# Patient Record
Sex: Female | Born: 2000 | Race: Black or African American | Hispanic: No | Marital: Single
Health system: Midwestern US, Community
[De-identification: ages and names within clinical notes are randomized; demographics above are authoritative.]

## PROBLEM LIST (undated history)

## (undated) VITALS — Temp 98.9°F

## (undated) DIAGNOSIS — J45909 Unspecified asthma, uncomplicated: Secondary | ICD-10-CM

## (undated) DIAGNOSIS — R519 Headache, unspecified: Secondary | ICD-10-CM

---
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
## 2019-06-12 ENCOUNTER — Emergency Department
Admission: EM | Admit: 2019-06-12 | Discharge: 2019-06-12 | Disposition: A | Discharge status: Home / Self Care | Payer: BC Managed Care – PPO | Attending: Emergency Medicine | Admitting: Emergency Medicine

## 2019-06-12 ENCOUNTER — Other Ambulatory Visit: Payer: Self-pay

## 2019-06-12 ENCOUNTER — Encounter: Payer: Self-pay | Admitting: Emergency Medicine

## 2019-06-12 DIAGNOSIS — Y929 Unspecified place or not applicable: Secondary | ICD-10-CM | POA: Insufficient documentation

## 2019-06-12 DIAGNOSIS — W2209XA Striking against other stationary object, initial encounter: Secondary | ICD-10-CM | POA: Insufficient documentation

## 2019-06-12 DIAGNOSIS — Y939 Activity, unspecified: Secondary | ICD-10-CM | POA: Insufficient documentation

## 2019-06-12 DIAGNOSIS — S61309A Unspecified open wound of unspecified finger with damage to nail, initial encounter: Secondary | ICD-10-CM | POA: Diagnosis not present

## 2019-06-12 DIAGNOSIS — Y999 Unspecified external cause status: Secondary | ICD-10-CM | POA: Diagnosis not present

## 2019-06-12 MED ORDER — CEPHALEXIN 500 MG PO CAPS: 500.0000 mg | ORAL_CAPSULE | Freq: Two times a day (BID) | ORAL | 0 refills | Status: AC

## 2019-06-12 MED ORDER — LIDOCAINE HCL (PF) 1 % IJ SOLN: 5.0000 mL | Freq: Once | INTRAMUSCULAR | Status: DC

## 2019-06-12 MED ORDER — CEPHALEXIN 500 MG PO CAPS
500.0000 mg | ORAL_CAPSULE | Freq: Once | ORAL | Status: AC
  Administered 2019-06-12: 500 mg via ORAL
  Filled 2019-06-12: qty 1

## 2019-06-12 MED ORDER — OXYCODONE-ACETAMINOPHEN 5-325 MG PO TABS
1.0000 | ORAL_TABLET | Freq: Once | ORAL | Status: AC
  Administered 2019-06-12: 1 via ORAL
  Filled 2019-06-12: qty 1

## 2019-06-12 NOTE — ED Provider Notes (Signed)
[facility] Emergency Department Provider Note ______   First MD Initiated Contact with Patient 06/12/19 [phone]     (approximate)  I have reviewed the triage vital signs and the nursing notes.   HISTORY  Chief Complaint Finger Injury    HPI Elizabeth Bell is a 18 y.o. female presents to the emergency department secondary to avulsion of left thumb acrylic and native fingernail tonight.  Patient admits to 10 out of 10 left thumb pain.  Patient states that nail was struck against something which raised the nail off the nail bed.        History reviewed. No pertinent past medical history.  There are no active problems to display for this patient.   History reviewed. No pertinent surgical history.  Prior to Admission medications   Medication Sig Start Date End Date Taking? Authorizing Provider  cephALEXin (KEFLEX) 500 MG capsule Take 1 capsule (500 mg total) by mouth 2 (two) times daily for 7 days. 06/12/19 06/19/19  Gregor Hams, MD    Allergies Patient has no known allergies.  No family history on file.  Social History Social History   Tobacco Use  . Smoking status: Never Smoker  . Smokeless tobacco: Never Used  Substance Use Topics  . Alcohol use: Not on file  . Drug use: Not on file    Review of Systems Constitutional: No fever/chills Eyes: No visual changes. ENT: No sore throat. Cardiovascular: Denies chest pain. Respiratory: Denies shortness of breath. Gastrointestinal: No abdominal pain.  No nausea, no vomiting.  No diarrhea.  No constipation. Genitourinary: Negative for dysuria. Musculoskeletal: Negative for neck pain.  Negative for back pain.  Positive for left thumb pain Integumentary: Negative for rash. Neurological: Negative for headaches, focal weakness or numbness.  ____________________________________________   PHYSICAL EXAM:  VITAL SIGNS: ED Triage Vitals  Enc Vitals Group     BP --      Pulse --      Resp  --      Temp --      Temp src --      SpO2 --      Weight 06/12/19 0027 46.3 kg (102 lb)     Height 06/12/19 0027 1.524 m (5')     Head Circumference --      Peak Flow --      Pain Score 06/12/19 0028 10     Pain Loc --      Pain Edu? --      Excl. in [place]? --     Constitutional: Alert and oriented.  Apparent discomfort Eyes: Conjunctivae are normal.  Neck: No stridor.  No meningeal signs.   Musculoskeletal: Partially avulsed left thumb fingernail however nailbed still intact Neurologic:  Normal speech and language. No gross focal neurologic deficits are appreciated.  Skin:  Skin is warm, dry and intact. Psychiatric: Mood and affect are normal. Speec .Nerve Block  Date/Time: 06/12/2019 4:01 AM Performed by: Gregor Hams, MD Authorized by: Gregor Hams, MD   Consent:    Consent obtained:  Verbal   Risks discussed:  Pain and unsuccessful block   Alternatives discussed:  Alternative treatment Indications:    Indications:  Pain relief and procedural anesthesia Pre-procedure details:    Skin preparation:  Alcohol   Preparation: Patient was prepped and draped in usual sterile fashion   Skin anesthesia (see MAR for exact dosages):    Skin anesthesia method:  Local infiltration   Local anesthetic:  Lidocaine 1%  w/o epi Procedure details (see MAR for exact dosages):    Block needle gauge:  25 G Post-procedure details:    Outcome:  Pain improved   Patient tolerance of procedure:  Tolerated well, no immediate complications     ____________________________________________   INITIAL IMPRESSION / MDM / ASSESSMENT AND PLAN / ED COURSE  As part of my medical decision making, I reviewed the following data within the electronic MEDICAL RECORD NUMBER   18 year old female presenting with above-stated history and physical exam secondary to partially avulsed left thumb fingernail.  Digital block performed wound subsequently irrigated with saline and Betadine nailbed secured under  the skin nail subsequently taped down to the nailbed  ____________________________________________  FINAL CLINICAL IMPRESSION(S) / ED DIAGNOSES  Final diagnoses:  Avulsion of fingernail, initial encounter     MEDICATIONS GIVEN DURING THIS VISIT:  Medications  lidocaine (PF) (XYLOCAINE) 1 % injection 5 mL (has no administration in time range)  oxyCODONE-acetaminophen (PERCOCET/ROXICET) 5-325 MG per tablet 1 tablet (1 tablet Oral Given 06/12/19 0257)  cephALEXin (KEFLEX) capsule 500 mg (500 mg Oral Given 06/12/19 0257)     ED Discharge Orders         Ordered    cephALEXin (KEFLEX) 500 MG capsule  2 times daily     06/12/19 0255          *Please note:  Elizabeth Bell was evaluated in Emergency Department on 06/12/2019 for the symptoms described in the history of present illness. She was evaluated in the context of the global COVID-19 pandemic, which necessitated consideration that the patient might be at risk for infection with the SARS-CoV-2 virus that causes COVID-19. Institutional protocols and algorithms that pertain to the evaluation of patients at risk for COVID-19 are in a state of rapid change based on information released by regulatory bodies including the CDC and federal and state organizations. These policies and algorithms were followed during the patient's care in the ED.  Some ED evaluations and interventions may be delayed as a result of limited staffing during the pandemic.*  Note:  This document was prepared using Dragon voice recognition software and may include unintentional dictation errors.   Gregor Hams, MD 06/12/19 [phone]

## 2019-06-12 NOTE — ED Triage Notes (Signed)
Patient ambulatory to triage with steady gait, without difficulty or distress noted, mask in place; pt reports acrylic thumbnail came off PTA injuring nailbed

## 2020-02-06 ENCOUNTER — Encounter (HOSPITAL_COMMUNITY): Payer: Self-pay

## 2020-02-06 ENCOUNTER — Emergency Department (HOSPITAL_COMMUNITY)
Admission: EM | Admit: 2020-02-06 | Discharge: 2020-02-07 | Disposition: A | Discharge status: Home / Self Care | Payer: BC Managed Care – PPO | Attending: Emergency Medicine | Admitting: Emergency Medicine

## 2020-02-06 ENCOUNTER — Emergency Department (HOSPITAL_COMMUNITY): Payer: BC Managed Care – PPO

## 2020-02-06 DIAGNOSIS — M25571 Pain in right ankle and joints of right foot: Secondary | ICD-10-CM | POA: Diagnosis not present

## 2020-02-06 DIAGNOSIS — Z5321 Procedure and treatment not carried out due to patient leaving prior to being seen by health care provider: Secondary | ICD-10-CM | POA: Diagnosis not present

## 2020-02-06 NOTE — ED Notes (Signed)
PT stated they were leaving
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
## 2020-10-01 ENCOUNTER — Other Ambulatory Visit: Payer: Self-pay

## 2020-10-01 ENCOUNTER — Emergency Department (HOSPITAL_COMMUNITY)
Admission: EM | Admit: 2020-10-01 | Discharge: 2020-10-01 | Disposition: A | Discharge status: Home / Self Care | Payer: BC Managed Care – PPO | Attending: Emergency Medicine | Admitting: Emergency Medicine

## 2020-10-01 DIAGNOSIS — R112 Nausea with vomiting, unspecified: Secondary | ICD-10-CM | POA: Insufficient documentation

## 2020-10-01 DIAGNOSIS — C719 Malignant neoplasm of brain, unspecified: Secondary | ICD-10-CM | POA: Diagnosis present

## 2020-10-01 DIAGNOSIS — Z5321 Procedure and treatment not carried out due to patient leaving prior to being seen by health care provider: Secondary | ICD-10-CM | POA: Insufficient documentation

## 2020-10-01 DIAGNOSIS — R109 Unspecified abdominal pain: Secondary | ICD-10-CM | POA: Insufficient documentation

## 2020-10-01 DIAGNOSIS — R630 Anorexia: Secondary | ICD-10-CM | POA: Insufficient documentation

## 2020-10-01 DIAGNOSIS — M5481 Occipital neuralgia: Secondary | ICD-10-CM | POA: Diagnosis not present

## 2020-10-01 LAB — COMPREHENSIVE METABOLIC PANEL
ALT: 11 U/L (ref 0–44)
AST: 18 U/L (ref 15–41)
Albumin: 3.7 g/dL (ref 3.5–5.0)
Alkaline Phosphatase: 71 U/L (ref 38–126)
Anion gap: 10 (ref 5–15)
BUN: 8 mg/dL (ref 6–20)
CO2: 26 mmol/L (ref 22–32)
Calcium: 9.1 mg/dL (ref 8.9–10.3)
Chloride: 103 mmol/L (ref 98–111)
Creatinine, Ser: 0.71 mg/dL (ref 0.44–1.00)
GFR, Estimated: >60 mL/min (ref >60)
Glucose, Bld: 83 mg/dL (ref 70–99)
Potassium: 4 mmol/L (ref 3.5–5.1)
Sodium: 139 mmol/L (ref 135–145)
Total Bilirubin: 0.4 mg/dL (ref 0.3–1.2)
Total Protein: 6.9 g/dL (ref 6.5–8.1)

## 2020-10-01 LAB — URINALYSIS, ROUTINE W REFLEX MICROSCOPIC
Bilirubin Urine: NEGATIVE
Glucose, UA: NEGATIVE mg/dL
Hgb urine dipstick: NEGATIVE
Ketones, ur: NEGATIVE mg/dL
Leukocytes,Ua: NEGATIVE
Nitrite: NEGATIVE
Protein, ur: NEGATIVE mg/dL
Specific Gravity, Urine: 1.018 (ref 1.005–1.030)
pH: 6 (ref 5.0–8.0)

## 2020-10-01 LAB — CBC
HCT: 40.4 % (ref 36.0–46.0)
Hemoglobin: 13.1 g/dL (ref 12.0–15.0)
MCH: 30.6 pg (ref 26.0–34.0)
MCHC: 32.4 g/dL (ref 30.0–36.0)
MCV: 94.4 fL (ref 80.0–100.0)
Platelets: 281 10*3/uL (ref 150–400)
RBC: 4.28 MIL/uL (ref 3.87–5.11)
RDW: 12.6 % (ref 11.5–15.5)
WBC: 4.2 10*3/uL (ref 4.0–10.5)
nRBC: 0 % (ref 0.0–0.2)

## 2020-10-01 LAB — LIPASE, BLOOD: Lipase: 26 U/L (ref 11–51)

## 2020-10-01 LAB — I-STAT BETA HCG BLOOD, ED (MC, WL, AP ONLY): I-stat hCG, quantitative: <5 m[IU]/mL (ref <5)
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
## 2020-10-01 NOTE — ED Triage Notes (Signed)
Pt reports dx with brain tumor. Reports Nausea, pain to posterior head. Today pt reports unable to finish her breakfast. Pt reports frequent emesis. Pt reports decreased appetite and R sided abdominal pain. Pt states MD was worried about possible gallbladder issues.

## 2020-10-01 NOTE — ED Notes (Addendum)
Pt stated that she is going to leave. Pt encouraged to follow up with her primary care provider or come back if she feels like she is having an emergency.

## 2021-02-19 ENCOUNTER — Other Ambulatory Visit: Payer: Self-pay

## 2021-02-19 ENCOUNTER — Encounter (HOSPITAL_COMMUNITY): Payer: Self-pay | Admitting: Emergency Medicine

## 2021-02-19 ENCOUNTER — Inpatient Hospital Stay (HOSPITAL_COMMUNITY): Payer: BC Managed Care – PPO

## 2021-02-19 ENCOUNTER — Inpatient Hospital Stay (HOSPITAL_COMMUNITY)
Admission: EM | Admit: 2021-02-19 | Discharge: 2021-02-23 | DRG: 604 | Disposition: A | Discharge status: Other Acute Inpatient Hospital | Payer: BC Managed Care – PPO | Attending: Internal Medicine | Admitting: Internal Medicine

## 2021-02-19 ENCOUNTER — Emergency Department (HOSPITAL_COMMUNITY): Payer: BC Managed Care – PPO

## 2021-02-19 DIAGNOSIS — S61511A Laceration without foreign body of right wrist, initial encounter: Secondary | ICD-10-CM | POA: Diagnosis present

## 2021-02-19 DIAGNOSIS — D352 Benign neoplasm of pituitary gland: Secondary | ICD-10-CM | POA: Diagnosis present

## 2021-02-19 DIAGNOSIS — S61512A Laceration without foreign body of left wrist, initial encounter: Secondary | ICD-10-CM | POA: Diagnosis present

## 2021-02-19 DIAGNOSIS — G312 Degeneration of nervous system due to alcohol: Secondary | ICD-10-CM | POA: Diagnosis present

## 2021-02-19 DIAGNOSIS — R45851 Suicidal ideations: Secondary | ICD-10-CM | POA: Diagnosis present

## 2021-02-19 DIAGNOSIS — G9340 Encephalopathy, unspecified: Secondary | ICD-10-CM | POA: Diagnosis not present

## 2021-02-19 DIAGNOSIS — F10129 Alcohol abuse with intoxication, unspecified: Secondary | ICD-10-CM | POA: Diagnosis present

## 2021-02-19 DIAGNOSIS — J9601 Acute respiratory failure with hypoxia: Secondary | ICD-10-CM | POA: Diagnosis present

## 2021-02-19 DIAGNOSIS — F332 Major depressive disorder, recurrent severe without psychotic features: Secondary | ICD-10-CM | POA: Diagnosis present

## 2021-02-19 DIAGNOSIS — Y906 Blood alcohol level of 120-199 mg/100 ml: Secondary | ICD-10-CM | POA: Diagnosis present

## 2021-02-19 DIAGNOSIS — Z23 Encounter for immunization: Secondary | ICD-10-CM

## 2021-02-19 DIAGNOSIS — Z20822 Contact with and (suspected) exposure to covid-19: Secondary | ICD-10-CM | POA: Diagnosis present

## 2021-02-19 DIAGNOSIS — Z781 Physical restraint status: Secondary | ICD-10-CM | POA: Diagnosis not present

## 2021-02-19 DIAGNOSIS — X780XXA Intentional self-harm by sharp glass, initial encounter: Secondary | ICD-10-CM | POA: Diagnosis present

## 2021-02-19 DIAGNOSIS — R092 Respiratory arrest: Secondary | ICD-10-CM

## 2021-02-19 HISTORY — DX: Headache, unspecified: R51.9

## 2021-02-19 HISTORY — DX: Unspecified asthma, uncomplicated: J45.909

## 2021-02-19 LAB — RAPID URINE DRUG SCREEN, HOSP PERFORMED
Amphetamines: NOT DETECTED
Barbiturates: NOT DETECTED
Benzodiazepines: POSITIVE — AB
Cocaine: NOT DETECTED
Opiates: NOT DETECTED
Tetrahydrocannabinol: NOT DETECTED

## 2021-02-19 LAB — BLOOD GAS, VENOUS
Acid-base deficit: 4.4 mmol/L — ABNORMAL HIGH (ref 0.0–2.0)
Acid-base deficit: 4.1 mmol/L — ABNORMAL HIGH (ref 0.0–2.0)
Bicarbonate: 18.2 mmol/L — ABNORMAL LOW (ref 20.0–28.0)
Bicarbonate: 21.8 mmol/L (ref 20.0–28.0)
O2 Saturation: 84.3 %
O2 Saturation: 72.8 %
Patient temperature: 98.6
Patient temperature: 98.6
pCO2, Ven: 27.9 mmHg — ABNORMAL LOW (ref 44.0–60.0)
pCO2, Ven: 45.2 mmHg (ref 44.0–60.0)
pH, Ven: 7.429 (ref 7.250–7.430)
pH, Ven: 7.304 (ref 7.250–7.430)
pO2, Ven: 49.9 mmHg — ABNORMAL HIGH (ref 32.0–45.0)
pO2, Ven: 45 mmHg (ref 32.0–45.0)

## 2021-02-19 LAB — CBC
HCT: 39 % (ref 36.0–46.0)
HCT: 40 % (ref 36.0–46.0)
Hemoglobin: 13.2 g/dL (ref 12.0–15.0)
Hemoglobin: 13.4 g/dL (ref 12.0–15.0)
MCH: 31.7 pg (ref 26.0–34.0)
MCH: 31.5 pg (ref 26.0–34.0)
MCHC: 33.8 g/dL (ref 30.0–36.0)
MCHC: 33.5 g/dL (ref 30.0–36.0)
MCV: 93.8 fL (ref 80.0–100.0)
MCV: 93.9 fL (ref 80.0–100.0)
Platelets: 280 10*3/uL (ref 150–400)
Platelets: 261 10*3/uL (ref 150–400)
RBC: 4.16 MIL/uL (ref 3.87–5.11)
RBC: 4.26 MIL/uL (ref 3.87–5.11)
RDW: 12.3 % (ref 11.5–15.5)
RDW: 12.2 % (ref 11.5–15.5)
WBC: 6.2 10*3/uL (ref 4.0–10.5)
WBC: 5.9 10*3/uL (ref 4.0–10.5)
nRBC: 0 % (ref 0.0–0.2)
nRBC: 0 % (ref 0.0–0.2)

## 2021-02-19 LAB — MAGNESIUM: Magnesium: 2.2 mg/dL (ref 1.7–2.4)

## 2021-02-19 LAB — TSH: TSH: 1.142 u[IU]/mL (ref 0.350–4.500)

## 2021-02-19 LAB — COMPREHENSIVE METABOLIC PANEL
ALT: 10 U/L (ref 0–44)
AST: 18 U/L (ref 15–41)
Albumin: 4.2 g/dL (ref 3.5–5.0)
Alkaline Phosphatase: 76 U/L (ref 38–126)
Anion gap: 7 (ref 5–15)
BUN: 8 mg/dL (ref 6–20)
CO2: 25 mmol/L (ref 22–32)
Calcium: 9.3 mg/dL (ref 8.9–10.3)
Chloride: 108 mmol/L (ref 98–111)
Creatinine, Ser: 0.78 mg/dL (ref 0.44–1.00)
GFR, Estimated: >60 mL/min (ref >60)
Glucose, Bld: 76 mg/dL (ref 70–99)
Potassium: 3.3 mmol/L — ABNORMAL LOW (ref 3.5–5.1)
Sodium: 140 mmol/L (ref 135–145)
Total Bilirubin: 0.4 mg/dL (ref 0.3–1.2)
Total Protein: 7.2 g/dL (ref 6.5–8.1)

## 2021-02-19 LAB — BASIC METABOLIC PANEL
Anion gap: 12 (ref 5–15)
BUN: 6 mg/dL (ref 6–20)
CO2: 21 mmol/L — ABNORMAL LOW (ref 22–32)
Calcium: 9.3 mg/dL (ref 8.9–10.3)
Chloride: 107 mmol/L (ref 98–111)
Creatinine, Ser: 0.57 mg/dL (ref 0.44–1.00)
GFR, Estimated: >60 mL/min (ref >60)
Glucose, Bld: 91 mg/dL (ref 70–99)
Potassium: 3.5 mmol/L (ref 3.5–5.1)
Sodium: 140 mmol/L (ref 135–145)

## 2021-02-19 LAB — I-STAT BETA HCG BLOOD, ED (MC, WL, AP ONLY): I-stat hCG, quantitative: <5 m[IU]/mL (ref <5)

## 2021-02-19 LAB — RESP PANEL BY RT-PCR (FLU A&B, COVID) ARPGX2
Influenza A by PCR: NEGATIVE
Influenza B by PCR: NEGATIVE
SARS Coronavirus 2 by RT PCR: NEGATIVE

## 2021-02-19 LAB — ETHANOL: Alcohol, Ethyl (B): 153 mg/dL — ABNORMAL HIGH (ref <10)

## 2021-02-19 LAB — ACETAMINOPHEN LEVEL: Acetaminophen (Tylenol), Serum: <10 ug/mL — ABNORMAL LOW (ref 10–30)

## 2021-02-19 LAB — PHOSPHORUS: Phosphorus: 3.4 mg/dL (ref 2.5–4.6)

## 2021-02-19 LAB — SALICYLATE LEVEL: Salicylate Lvl: <7 mg/dL — ABNORMAL LOW (ref 7.0–30.0)

## 2021-02-19 LAB — HIV ANTIBODY (ROUTINE TESTING W REFLEX): HIV Screen 4th Generation wRfx: NONREACTIVE

## 2021-02-19 MED ORDER — NALOXONE HCL 2 MG/2ML IJ SOSY
2.0000 mg | PREFILLED_SYRINGE | INTRAMUSCULAR | Status: DC | PRN
  Filled 2021-02-19: qty 2

## 2021-02-19 MED ORDER — LORAZEPAM 2 MG/ML IJ SOLN
1.0000 mg | INTRAMUSCULAR | Status: AC | PRN
  Administered 2021-02-19: 1 mg via INTRAVENOUS
  Filled 2021-02-19: qty 1

## 2021-02-19 MED ORDER — POLYETHYLENE GLYCOL 3350 17 G PO PACK
17.0000 g | PACK | Freq: Every day | ORAL | Status: DC
  Administered 2021-02-20 – 2021-02-22 (×3): 17 g
  Filled 2021-02-19 (×3): qty 1

## 2021-02-19 MED ORDER — LORAZEPAM 1 MG PO TABS: 1.0000 mg | ORAL_TABLET | ORAL | Status: AC | PRN

## 2021-02-19 MED ORDER — THIAMINE HCL 100 MG/ML IJ SOLN: 100.0000 mg | Freq: Every day | INTRAMUSCULAR | Status: DC

## 2021-02-19 MED ORDER — FENTANYL CITRATE (PF) 100 MCG/2ML IJ SOLN
50.0000 ug | Freq: Once | INTRAMUSCULAR | Status: DC
  Filled 2021-02-19: qty 2

## 2021-02-19 MED ORDER — FENTANYL CITRATE (PF) 100 MCG/2ML IJ SOLN
50.0000 ug | INTRAMUSCULAR | Status: DC | PRN
Start: 2021-02-19 — End: 2021-02-19

## 2021-02-19 MED ORDER — FENTANYL 2500MCG IN NS 250ML (10MCG/ML) PREMIX INFUSION
0.0000 ug/h | INTRAVENOUS | Status: DC
  Filled 2021-02-19: qty 250

## 2021-02-19 MED ORDER — ETOMIDATE 2 MG/ML IV SOLN
INTRAVENOUS | Status: AC | PRN
  Administered 2021-02-19: 20 mg via INTRAVENOUS

## 2021-02-19 MED ORDER — ADULT MULTIVITAMIN W/MINERALS CH
1.0000 | ORAL_TABLET | Freq: Every day | ORAL | Status: DC
  Administered 2021-02-19 – 2021-02-23 (×5): 1 via ORAL
  Filled 2021-02-19 (×5): qty 1

## 2021-02-19 MED ORDER — AMMONIA AROMATIC IN INHA
1.0000 | Freq: Once | RESPIRATORY_TRACT | Status: DC
  Filled 2021-02-19: qty 10

## 2021-02-19 MED ORDER — POLYETHYLENE GLYCOL 3350 17 G PO PACK: 17.0000 g | PACK | Freq: Every day | ORAL | Status: DC | PRN

## 2021-02-19 MED ORDER — FOLIC ACID 1 MG PO TABS
1.0000 mg | ORAL_TABLET | Freq: Every day | ORAL | Status: DC
  Administered 2021-02-19 – 2021-02-23 (×5): 1 mg via ORAL
  Filled 2021-02-19 (×5): qty 1

## 2021-02-19 MED ORDER — DOCUSATE SODIUM 100 MG PO CAPS: 100.0000 mg | ORAL_CAPSULE | Freq: Two times a day (BID) | ORAL | Status: DC | PRN

## 2021-02-19 MED ORDER — ROCURONIUM BROMIDE 50 MG/5ML IV SOLN
INTRAVENOUS | Status: AC | PRN
  Administered 2021-02-19: 80 mg via INTRAVENOUS

## 2021-02-19 MED ORDER — ENOXAPARIN SODIUM 40 MG/0.4ML IJ SOSY
40.0000 mg | PREFILLED_SYRINGE | INTRAMUSCULAR | Status: DC
  Administered 2021-02-19: 40 mg via SUBCUTANEOUS
  Filled 2021-02-19: qty 0.4

## 2021-02-19 MED ORDER — TETANUS-DIPHTH-ACELL PERTUSSIS 5-2.5-18.5 LF-MCG/0.5 IM SUSY
0.5000 mL | PREFILLED_SYRINGE | Freq: Once | INTRAMUSCULAR | Status: AC
  Administered 2021-02-19: 0.5 mL via INTRAMUSCULAR
  Filled 2021-02-19: qty 0.5

## 2021-02-19 MED ORDER — THIAMINE HCL 100 MG PO TABS
100.0000 mg | ORAL_TABLET | Freq: Every day | ORAL | Status: DC
  Administered 2021-02-19 – 2021-02-23 (×5): 100 mg via ORAL
  Filled 2021-02-19 (×5): qty 1

## 2021-02-19 MED ORDER — ROCURONIUM BROMIDE 10 MG/ML (PF) SYRINGE
PREFILLED_SYRINGE | INTRAVENOUS | Status: AC
  Filled 2021-02-19: qty 10

## 2021-02-19 MED ORDER — PROPOFOL 1000 MG/100ML IV EMUL
0.0000 ug/kg/min | INTRAVENOUS | Status: DC
  Filled 2021-02-19: qty 100

## 2021-02-19 MED ORDER — DOCUSATE SODIUM 100 MG PO CAPS
100.0000 mg | ORAL_CAPSULE | Freq: Two times a day (BID) | ORAL | Status: DC
  Administered 2021-02-19 – 2021-02-23 (×6): 100 mg via ORAL
  Filled 2021-02-19 (×8): qty 1

## 2021-02-19 MED ORDER — ETOMIDATE 2 MG/ML IV SOLN
INTRAVENOUS | Status: AC
  Filled 2021-02-19: qty 10

## 2021-02-19 MED ORDER — DOCUSATE SODIUM 50 MG/5ML PO LIQD
100.0000 mg | Freq: Two times a day (BID) | ORAL | Status: DC
  Filled 2021-02-19 (×2): qty 10

## 2021-02-19 NOTE — ED Notes (Addendum)
Patient noted to have jerking movements and slight irritability. Obtained order for propofol and fentanyl push. This RN stepped out of room to pull medication for patient. Upon entry back into room patient was found to have pulled out endotracheal tube. MD notified and called to room for patient assessment. Patient O2 sats 100 % and RR within normal limits. Patient was able to answer questions appropriately. Will continue to monitor for decreased level of consciousness or difficulty breathing.

## 2021-02-19 NOTE — Sedation Documentation (Addendum)
Successful intubation performed by MD Palumbo. Bilateral lung sounds auscultated at this time.   ET Tube Size: 7.5 mm  Tube placement: 23 cm measured at the teeth

## 2021-02-19 NOTE — ED Notes (Signed)
Critical care doctor regarding patient current status and to be reevaluation for bed placement.

## 2021-02-19 NOTE — ED Notes (Signed)
Medication drawn up and ready for administration. Secondary verification of drug amounts performed by Norwood Levo, RN.

## 2021-02-19 NOTE — ED Notes (Addendum)
Respiratory and MD Palumbo at bedside. Patient ready for intubation. Verbal order for 20 mg Etomidate and 80 mg Rocuronium given by Randal Buba, MD at this time.
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
## 2021-02-19 NOTE — Progress Notes (Signed)
PCCM Update: Self extubated this morning and is doing well from a respiratory standpoint as well as a neurologic standpoint as she is awake/alert and oriented.  Due to her change in clinical status I have placed an order for her to be admitted to the progressive care unit and we will plan to transfer her to the hospitalist service to take over her care tomorrow.  I have placed an inpatient psychiatry consult for the suicidal ideation.  She has an order for a one-to-one sitter.  I have ordered a regular diet for her.  I updated her mother over the phone.  Freda Jackson, MD [facility] Office: [phone]   See Amion for personal pager PCCM on call pager [phone] until 7pm. Please call Elink 7p-7a. [phone]
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
## 2021-02-19 NOTE — ED Notes (Signed)
Patient given sandwich.  

## 2021-02-19 NOTE — Progress Notes (Signed)
Several attempts made for arterial blood with no more than a flash obtained. RN aware

## 2021-02-19 NOTE — ED Notes (Signed)
Patient non responsive and vomiting. Unable to protect airway, per MD Palumbo prepare patient for intubation.

## 2021-02-19 NOTE — ED Notes (Signed)
Patient transported to X-ray 

## 2021-02-19 NOTE — H&P (Addendum)
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 Found to be essentially unresponsive on arrival to ED and vomiting. Intubated in ED, concern for aspiration. No significant in-line or gastric secretions noted however. She self-extubated shortly after my arrival to [facility] ED.    She is drowsy post extubation and says that she has had no interest in living over the last couple of months. She does not elaborate. She says she did drink alcohol earlier but doesn't specify quantity. She says she does not drink on a routine basis. Reports no other ingestions. Lives by herself. She agreed to let me reach out to her family tonight who report that while she has been prescribed a couple of medications by [facility] (verapamil, baclofen), they don't believe she's picked them up. She has not had a prior suicide attempt by her or their report, although she has cut wrists before.  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 rate 15, SpO2 100 %.    Vent Mode: PRVC FiO2 (%):  [100 %] 100 % Set Rate:  [16 bmp] 16 bmp Vt Set:  [460 mL] 460  mL PEEP:  [5 cmH20] 5 cmH20 Plateau Pressure:  [14 cmH20] 14 cmH20  No intake or output data in the 24 hours ending 02/19/21 0252 There were no vitals filed for this visit.  Examination: General: drowsy but oriented x3 HENT: NCAT, dry MM Lungs: CTAB, normal work of breathing Cardiovascular: RRR, no murmur, no JVD  Abdomen: soft, nontender, normal bowel sounds Extremities: warm, well-perfused without cyanosis, edema. Superficial cuts over right wrist, bilateral thigh. Neuro: grossly nonfocal, follows commands  Resolved Hospital Problem list   n/a  Assessment & Plan:   # Acute encephalopathy:  # Suicidal ideation: May have had alcohol intoxication/withdrawal given versed 5mg  by EMS, ultimately unresponsive in ED, intubated for airway protection, self-extubated in ED. - CTH, EKG, serum osm, tsh, t4, repeat vbg pending - capnography - CIWA for now although by family and patient report, she does not use alcohol routinely - sitter requested - psychiatry evaluation in AM when more alert  Best practice:  Diet: NPO Pain/Anxiety/Delirium protocol (if indicated): CIWA VAP protocol (if indicated): no DVT prophylaxis: awaiting CTH GI prophylaxis: not indicated Glucose control: will check glucose with AM labs Mobility: bed level Code Status: Full Family Communication: updated by phone tonight Disposition: MICU  Labs   CBC: Recent Labs  Lab 02/19/21 0136  WBC 6.2  HGB 13.2  HCT 39.0  MCV 93.8  PLT 144    Basic Metabolic Panel: Recent Labs  Lab 02/19/21 0136  NA 140  K 3.3*  CL 108  CO2 25  GLUCOSE 76  BUN 8  CREATININE 0.78  CALCIUM 9.3   GFR: CrCl cannot be calculated (Unknown ideal weight.). Recent Labs  Lab 02/19/21 0136  WBC 6.2    Liver Function Tests: Recent Labs  Lab 02/19/21 0136  AST 18  ALT 10  ALKPHOS 76  BILITOT 0.4  PROT 7.2  ALBUMIN 4.2   No results for input(s): LIPASE, AMYLASE in the last 168 hours. No results for input(s):  AMMONIA in the last 168 hours.  ABG No results found for: PHART, PCO2ART, PO2ART, HCO3, TCO2, ACIDBASEDEF, O2SAT   Coagulation Profile: No results for input(s): INR, PROTIME in the last 168 hours.  Cardiac Enzymes: No results for input(s): CKTOTAL, CKMB, CKMBINDEX, TROPONINI in the last 168 hours.  HbA1C: No results found for: HGBA1C  CBG: No results for input(s): GLUCAP in the last 168 hours.  Review of Systems:   12 point review of systems is negative except as otherwise noted in HPI  Past Medical History  She,  has no past medical history on file.   Surgical History   History reviewed. No pertinent surgical history.   Social History   reports that she has never smoked. She has never used smokeless tobacco.   Family History   Her family history is not on file.   Allergies No Known Allergies   Home Medications  Prior to Admission medications   Not on File     Critical care time: 32 minutes    This patient is critically ill with acute encephalopathy requiring intubation and mechanical ventilation for airway protection; which, requires frequent high complexity decision making, assessment, support, evaluation, and titration of therapies. This was completed through the application of advanced monitoring technologies and extensive interpretation of multiple databases. During this encounter critical care time was devoted to patient care services described in this note for 32 minutes.  Letta Median, Pulmonary/Critical Care

## 2021-02-20 ENCOUNTER — Encounter (HOSPITAL_COMMUNITY): Payer: Self-pay | Admitting: Student

## 2021-02-20 DIAGNOSIS — F332 Major depressive disorder, recurrent severe without psychotic features: Secondary | ICD-10-CM | POA: Diagnosis not present

## 2021-02-20 LAB — OSMOLALITY: Osmolality: 328 mOsm/kg (ref 275–295)

## 2021-02-20 LAB — TRIGLYCERIDES: Triglycerides: 68 mg/dL (ref <150)

## 2021-02-20 LAB — PROLACTIN: Prolactin: 95.9 ng/mL — ABNORMAL HIGH (ref 4.8–23.3)

## 2021-02-20 MED ORDER — TRAZODONE HCL 50 MG PO TABS: 50.0000 mg | ORAL_TABLET | Freq: Every evening | ORAL | Status: DC | PRN

## 2021-02-20 MED ORDER — FLUOXETINE HCL 10 MG PO CAPS
10.0000 mg | ORAL_CAPSULE | Freq: Every day | ORAL | Status: DC
  Administered 2021-02-21: 10 mg via ORAL
  Filled 2021-02-20: qty 1

## 2021-02-20 NOTE — ED Notes (Signed)
Sitter at bedside.

## 2021-02-20 NOTE — Progress Notes (Addendum)
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 Found to be essentially unresponsive on arrival to ED and vomiting. Intubated in ED, concern for aspiration. She self-extubated shortly after.  Admitted under PCCM initially and transferred under [place] on 02/20/2021.  Assessment & Plan:   Active Problems:   Acute encephalopathy  Acute encephalopathy: Resolved.  She is fully alert and oriented.  CT head unremarkable.  Suicidal ideation: Although at this point in time, she denies any suicidal ideation however is able to she clearly seems to be very depressed.  I find it hard to trust her statement today.  I have consulted psychiatry to assess her.  Acute hypoxic respiratory failure: Needed to be intubated in the ED however she self extubated herself shortly after and has remained on room air and is comfortable.  Chest x-ray negative for infiltrates.  Alcohol intoxication: Her alcohol level was 153 upon arrival.  No signs of withdrawal.  Continue CIWA protocol.  Addendum: Patient is medically cleared for discharge to inpatient psych unit.  DVT prophylaxis: enoxaparin (LOVENOX) injection 40 mg Start: 02/19/21 1800 SCDs Start: 02/19/21 0334   Code Status: Full Code  Family Communication: None present at bedside.  Plan of care discussed with patient in length and he verbalized understanding and agreed with it.  Status is: Inpatient  Remains inpatient appropriate because:Inpatient level of care appropriate due to severity of illness   Dispo:  Patient From: Home  Planned Disposition: Home  Medically stable for discharge: No          Estimated body mass index is 20.97 kg/m as calculated from the following:   Height as of this  encounter: 5\' 2"  (1.575 m).   Weight as of this encounter: 52 kg.      Nutritional status:               Consultants:   Psychiatry  Procedures:   Intubation  Antimicrobials:  Anti-infectives (From admission, onward)   None         Subjective: Patient seen and examined.  She wants to sleep and does not want to talk much.  When I asked her how she is feeling, she tells me " she knows" pointing towards the laboratory tech present at the bedside.  I tried to engage in the conversation and asked the same question again and she responded " I am thirsty" and would not say anything else and close her eyes.  Once again I asked the question third time and she said " when can I get my room".  And again we will intentionally close her eyes and will avoid any sort of conversation.  Objective: Vitals:   02/20/21 0900 02/20/21 0930 02/20/21 1000 02/20/21 1100  BP: 110/74 113/75 107/77 115/78  Pulse: 72 89 90 73  Resp:  15 16 16   Temp: 99 F (37.2 C) 98.9 F (37.2 C) 98.9 F (37.2 C) 98.8 F (37.1 C)  TempSrc:      SpO2: 100% 100% 99% 100%  Weight:      Height:        Intake/Output Summary (Last 24 hours) at 02/20/2021 1139 Last data filed at 02/20/2021 0956 Gross per 24 hour  Intake 240 ml  Output 1250 ml  Net -1010 ml  Filed Weights   02/19/21 0315 02/19/21 2346  Weight: 52 kg 52 kg    Examination:  General exam: Appears calm and comfortable  Respiratory system: Clear to auscultation. Respiratory effort normal. Cardiovascular system: S1 & S2 heard, RRR. No JVD, murmurs, rubs, gallops or clicks. No pedal edema. Gastrointestinal system: Abdomen is nondistended, soft and nontender. No organomegaly or masses felt. Normal bowel sounds heard. Central nervous system: Alert and oriented. No focal neurological deficits. Extremities: Symmetric 5 x 5 power. Skin: No rashes, lesions or ulcers Psychiatry: Judgement and insight appear poor, mood and affect  flat.   Data Reviewed: I have personally reviewed following labs and imaging studies  CBC: Recent Labs  Lab 02/19/21 0136 02/19/21 0451  WBC 6.2 5.9  HGB 13.2 13.4  HCT 39.0 40.0  MCV 93.8 93.9  PLT 280 976   Basic Metabolic Panel: Recent Labs  Lab 02/19/21 0134 02/19/21 0136 02/19/21 0451  NA  --  140 140  K  --  3.3* 3.5  CL  --  108 107  CO2  --  25 21*  GLUCOSE  --  76 91  BUN  --  8 6  CREATININE  --  0.78 0.57  CALCIUM  --  9.3 9.3  MG 2.2  --   --   PHOS 3.4  --   --    GFR: Estimated Creatinine Clearance: 88 mL/min (by C-G formula based on SCr of 0.57 mg/dL). Liver Function Tests: Recent Labs  Lab 02/19/21 0136  AST 18  ALT 10  ALKPHOS 76  BILITOT 0.4  PROT 7.2  ALBUMIN 4.2   No results for input(s): LIPASE, AMYLASE in the last 168 hours. No results for input(s): AMMONIA in the last 168 hours. Coagulation Profile: No results for input(s): INR, PROTIME in the last 168 hours. Cardiac Enzymes: No results for input(s): CKTOTAL, CKMB, CKMBINDEX, TROPONINI in the last 168 hours. BNP (last 3 results) No results for input(s): PROBNP in the last 8760 hours. HbA1C: No results for input(s): HGBA1C in the last 72 hours. CBG: No results for input(s): GLUCAP in the last 168 hours. Lipid Profile: Recent Labs    02/20/21 0601  TRIG 68   Thyroid Function Tests: Recent Labs    02/19/21 0357  TSH 1.142   Anemia Panel: No results for input(s): VITAMINB12, FOLATE, FERRITIN, TIBC, IRON, RETICCTPCT in the last 72 hours. Sepsis Labs: No results for input(s): PROCALCITON, LATICACIDVEN in the last 168 hours.  Recent Results (from the past 240 hour(s))  Resp Panel by RT-PCR (Flu A&B, Covid) Nasopharyngeal Swab     Status: None   Collection Time: 02/19/21  1:31 AM   Specimen: Nasopharyngeal Swab; Nasopharyngeal(NP) swabs in vial transport medium  Result Value Ref Range Status   SARS Coronavirus 2 by RT PCR NEGATIVE NEGATIVE Final    Comment:  (NOTE) SARS-CoV-2 target nucleic acids are NOT DETECTED.  The SARS-CoV-2 RNA is generally detectable in upper respiratory specimens during the acute phase of infection. The lowest concentration of SARS-CoV-2 viral copies this assay can detect is 138 copies/mL. A negative result does not preclude SARS-Cov-2 infection and should not be used as the sole basis for treatment or other patient management decisions. A negative result may occur with  improper specimen collection/handling, submission of specimen other than nasopharyngeal swab, presence of viral mutation(s) within the areas targeted by this assay, and inadequate number of viral copies(<138 copies/mL). A negative result must be combined with clinical observations, patient history, and epidemiological information.  The expected result is Negative.  Fact Sheet for Patients:  EntrepreneurPulse.com.au  Fact Sheet for Healthcare Providers:  IncredibleEmployment.be  This test is no t yet approved or cleared by the [place] FDA and  has been authorized for detection and/or diagnosis of SARS-CoV-2 by FDA under an Emergency Use Authorization (EUA). This EUA will remain  in effect (meaning this test can be used) for the duration of the COVID-19 declaration under Section 564(b)(1) of the Act, 21 U.S.C.section 360bbb-3(b)(1), unless the authorization is terminated  or revoked sooner.       Influenza A by PCR NEGATIVE NEGATIVE Final   Influenza B by PCR NEGATIVE NEGATIVE Final    Comment: (NOTE) The Xpert Xpress SARS-CoV-2/FLU/RSV plus assay is intended as an aid in the diagnosis of influenza from Nasopharyngeal swab specimens and should not be used as a sole basis for treatment. Nasal washings and aspirates are unacceptable for Xpert Xpress SARS-CoV-2/FLU/RSV testing.  Fact Sheet for Patients: EntrepreneurPulse.com.au  Fact Sheet for Healthcare  Providers: IncredibleEmployment.be  This test is not yet approved or cleared by the [place] FDA and has been authorized for detection and/or diagnosis of SARS-CoV-2 by FDA under an Emergency Use Authorization (EUA). This EUA will remain in effect (meaning this test can be used) for the duration of the COVID-19 declaration under Section 564(b)(1) of the Act, 21 U.S.C. section 360bbb-3(b)(1), unless the authorization is terminated or revoked.  Performed at [facility], [place] [address] [place], [place] [address]       Radiology Studies: CT HEAD WO CONTRAST  Result Date: 02/19/2021 CLINICAL DATA:  History of pituitary adenoma.  Acute encephalopathy. EXAM: CT HEAD WITHOUT CONTRAST TECHNIQUE: Contiguous axial images were obtained from the base of the skull through the vertex without intravenous contrast. COMPARISON:  None. FINDINGS: Brain: No evidence of acute infarction, hemorrhage, hydrocephalus, extra-axial collection or mass lesion/mass effect. History of pituitary adenoma with no visible abnormality at the sella. Vascular: No hyperdense vessel or unexpected calcification. Skull: Normal. Negative for fracture or focal lesion. Sinuses/Orbits: No acute finding. IMPRESSION: Negative head CT. Electronically Signed   By: Monte Fantasia M.D.   On: 02/19/2021 05:00   DG Chest Portable 1 View  Result Date: 02/19/2021 CLINICAL DATA:  Intubated EXAM: PORTABLE CHEST 1 VIEW COMPARISON:  None. FINDINGS: Endotracheal tube tip just above the carina. Esophageal tube tip overlies the stomach. No focal consolidation or effusion. Normal cardiomediastinal silhouette. No pneumothorax. IMPRESSION: 1. Endotracheal tube tip just above the carina 2. Esophageal tube tip overlies the stomach 3. Clear lung fields Electronically Signed   By: Donavan Foil M.D.   On: 02/19/2021 02:31    Scheduled Meds: . docusate sodium  100 mg Oral BID  . enoxaparin (LOVENOX) injection  40  mg Subcutaneous Q24H  . folic acid  1 mg Oral Daily  . multivitamin with minerals  1 tablet Oral Daily  . polyethylene glycol  17 g Per Tube Daily  . thiamine  100 mg Oral Daily   Or  . thiamine  100 mg Intravenous Daily   Continuous Infusions:   LOS: 1 day   Time spent: 32 minutes   Darliss Cheney, MD [facility]  02/20/2021, 11:39 AM   How to contact the [facility] Attending or Consulting provider [place] or covering provider during after hours [place], for this patient?  1. Check the care team in [facility] and look for a) attending/consulting [facility] provider listed and b) the [facility] team listed. Page or  secure chat [phone]. 2. Log into [email or web address] and use Center's universal password to access. If you do not have the password, please contact the hospital operator. 3. Locate the [facility] provider you are looking for under [facility] and page to a number that you can be directly reached. 4. If you still have difficulty reaching the provider, please page the [facility] (Director on Call) for the Hospitalists listed on amion for assistance.

## 2021-02-20 NOTE — Consult Note (Signed)
[facility]  Face-to-Face Psychiatry Consult   Reason for Consult: ''suicide attempt'' Referring Physician:  Leslye Peer, MD Patient Identification: Elizabeth Bell MRN:  [id number] Principal Diagnosis: Major depressive disorder, recurrent episode, severe ([place]) Diagnosis:  Principal Problem:   Major depressive disorder, recurrent episode, severe ([place]) Active Problems:   Acute encephalopathy   Total Time spent with patient: 1 hour  Subjective:   Elizabeth Bell is a 20 y.o. female patient admitted after she attempted suicide.  HPI:  20y/o female with suspected nonfunctioning pituitary adenoma, headaches, generalized anxiety disorder, depression who was admitted to the hospital with acute encephalopathy-unresponsive and suicide attempt by self cutting. Patient reports neurovegetative symptoms of depression characterized by recurrent suicidal thoughts, poor sleep, hopelessness, social withdrawal, lack of motivation and poor energy level. She reports current multiple stressors in her life which include: unemployment, financial issues, boyfriend in jail, mother recently had stroke and dealing with medical issues herself-Benign Pituitary adenoma. She reports that she was so overwhelmed and decided to take her life by cutting and drinking alcohol(BAL on admission-153). She denies psychosis, delusions and homicidal ideations.    Past Psychiatric History: as above  Risk to Self:  yes-unable to contract for safety Risk to Others:  denies Prior Inpatient Therapy:  denies Prior Outpatient Therapy:  none  Past Medical History:  Past Medical History:  Diagnosis Date  . Asthma   . Headache    History reviewed. No pertinent surgical history. Family History: History reviewed. No pertinent family history. Family Psychiatric  History:  Social History:  Social History   Substance and Sexual Activity  Alcohol Use None     Social History   Substance and Sexual Activity  Drug Use Not on file     Social History   Socioeconomic History  . Marital status: Unknown    Spouse name: Not on file  . Number of children: Not on file  . Years of education: Not on file  . Highest education level: Not on file  Occupational History  . Not on file  Tobacco Use  . Smoking status: Never Smoker  . Smokeless tobacco: Never Used  Vaping Use  . Vaping Use: Never used  Substance and Sexual Activity  . Alcohol use: Not on file  . Drug use: Not on file  . Sexual activity: Not on file  Other Topics Concern  . Not on file  Social History Narrative  . Not on file   Social Determinants of Health   Financial Resource Strain: Not on file  Food Insecurity: Not on file  Transportation Needs: Not on file  Physical Activity: Not on file  Stress: Not on file  Social Connections: Not on file   Additional Social History:    Allergies:  No Known Allergies  Labs:  Results for orders placed or performed during the hospital encounter of 02/19/21 (from the past 48 hour(s))  Resp Panel by RT-PCR (Flu A&B, Covid) Nasopharyngeal Swab     Status: None   Collection Time: 02/19/21  1:31 AM   Specimen: Nasopharyngeal Swab; Nasopharyngeal(NP) swabs in vial transport medium  Result Value Ref Range   SARS Coronavirus 2 by RT PCR NEGATIVE NEGATIVE    Comment: (NOTE) SARS-CoV-2 target nucleic acids are NOT DETECTED.  The SARS-CoV-2 RNA is generally detectable in upper respiratory specimens during the acute phase of infection. The lowest concentration of SARS-CoV-2 viral copies this assay can detect is 138 copies/mL. A negative result does not preclude SARS-Cov-2 infection and should not be used as the sole  basis for treatment or other patient management decisions. A negative result may occur with  improper specimen collection/handling, submission of specimen other than nasopharyngeal swab, presence of viral mutation(s) within the areas targeted by this assay, and inadequate number of viral copies(<138  copies/mL). A negative result must be combined with clinical observations, patient history, and epidemiological information. The expected result is Negative.  Fact Sheet for Patients:  EntrepreneurPulse.com.au  Fact Sheet for Healthcare Providers:  IncredibleEmployment.be  This test is no t yet approved or cleared by the [place] FDA and  has been authorized for detection and/or diagnosis of SARS-CoV-2 by FDA under an Emergency Use Authorization (EUA). This EUA will remain  in effect (meaning this test can be used) for the duration of the COVID-19 declaration under Section 564(b)(1) of the Act, 21 U.S.C.section 360bbb-3(b)(1), unless the authorization is terminated  or revoked sooner.       Influenza A by PCR NEGATIVE NEGATIVE   Influenza B by PCR NEGATIVE NEGATIVE    Comment: (NOTE) The Xpert Xpress SARS-CoV-2/FLU/RSV plus assay is intended as an aid in the diagnosis of influenza from Nasopharyngeal swab specimens and should not be used as a sole basis for treatment. Nasal washings and aspirates are unacceptable for Xpert Xpress SARS-CoV-2/FLU/RSV testing.  Fact Sheet for Patients: EntrepreneurPulse.com.au  Fact Sheet for Healthcare Providers: IncredibleEmployment.be  This test is not yet approved or cleared by the [place] FDA and has been authorized for detection and/or diagnosis of SARS-CoV-2 by FDA under an Emergency Use Authorization (EUA). This EUA will remain in effect (meaning this test can be used) for the duration of the COVID-19 declaration under Section 564(b)(1) of the Act, 21 U.S.C. section 360bbb-3(b)(1), unless the authorization is terminated or revoked.  Performed at [facility], [place] [address], [place], [place] [address]   Magnesium     Status: None   Collection Time: 02/19/21  1:34 AM  Result Value Ref Range   Magnesium 2.2 1.7 - 2.4 mg/dL     Comment: Performed at [facility], [place] [address] [place], [place] [address]  Phosphorus     Status: None   Collection Time: 02/19/21  1:34 AM  Result Value Ref Range   Phosphorus 3.4 2.5 - 4.6 mg/dL    Comment: Performed at [facility], [place] [address], [place], [place] [address]  Comprehensive metabolic panel     Status: Abnormal   Collection Time: 02/19/21  1:36 AM  Result Value Ref Range   Sodium 140 135 - 145 mmol/L   Potassium 3.3 (L) 3.5 - 5.1 mmol/L   Chloride 108 98 - 111 mmol/L   CO2 25 22 - 32 mmol/L   Glucose, Bld 76 70 - 99 mg/dL    Comment: Glucose reference range applies only to samples taken after fasting for at least 8 hours.   BUN 8 6 - 20 mg/dL   Creatinine, Ser 0.78 0.44 - 1.00 mg/dL   Calcium 9.3 8.9 - 10.3 mg/dL   Total Protein 7.2 6.5 - 8.1 g/dL   Albumin 4.2 3.5 - 5.0 g/dL   AST 18 15 - 41 U/L   ALT 10 0 - 44 U/L   Alkaline Phosphatase 76 38 - 126 U/L   Total Bilirubin 0.4 0.3 - 1.2 mg/dL   GFR, Estimated >60 >60 mL/min    Comment: (NOTE) Calculated using the CKD-EPI Creatinine Equation (2021)    Anion gap 7 5 - 15    Comment: Performed at  [facility], [place] [address] [place], [place] [address]  Ethanol     Status: Abnormal   Collection Time: 02/19/21  1:36 AM  Result Value Ref Range   Alcohol, Ethyl (B) 153 (H) <10 mg/dL    Comment: (NOTE) Lowest detectable limit for serum alcohol is 10 mg/dL.  For medical purposes only. Performed at [facility], [place] [address] [place], [place] [address]   Salicylate level     Status: Abnormal   Collection Time: 02/19/21  1:36 AM  Result Value Ref Range   Salicylate Lvl <7.4 (L) 7.0 - 30.0 mg/dL    Comment: Performed at [facility], [place] [address], [place], [place] [address]  Acetaminophen level     Status: Abnormal   Collection Time: 02/19/21  1:36 AM  Result Value Ref Range   Acetaminophen (Tylenol), Serum  <10 (L) 10 - 30 ug/mL    Comment: (NOTE) Therapeutic concentrations vary significantly. A range of 10-30 ug/mL  may be an effective concentration for many patients. However, some  are best treated at concentrations outside of this range. Acetaminophen concentrations >150 ug/mL at 4 hours after ingestion  and >50 ug/mL at 12 hours after ingestion are often associated with  toxic reactions.  Performed at [facility], [place] [address], [place], [place] [address]   cbc     Status: None   Collection Time: 02/19/21  1:36 AM  Result Value Ref Range   WBC 6.2 4.0 - 10.5 K/uL   RBC 4.16 3.87 - 5.11 MIL/uL   Hemoglobin 13.2 12.0 - 15.0 g/dL   HCT 39.0 36.0 - 46.0 %   MCV 93.8 80.0 - 100.0 fL   MCH 31.7 26.0 - 34.0 pg   MCHC 33.8 30.0 - 36.0 g/dL   RDW 12.3 11.5 - 15.5 %   Platelets 280 150 - 400 K/uL   nRBC 0.0 0.0 - 0.2 %    Comment: Performed at [facility], [place] [address] [place], [place] [address]  I-Stat beta hCG blood, ED     Status: None   Collection Time: 02/19/21  1:37 AM  Result Value Ref Range   I-stat hCG, quantitative <5.0 <5 mIU/mL   Comment 3            Comment:   GEST. AGE      CONC.  (mIU/mL)   <=1 WEEK        5 - 50     2 WEEKS       50 - 500     3 WEEKS       100 - 10,000     4 WEEKS     1,000 - 30,000        FEMALE AND NON-PREGNANT FEMALE:     LESS THAN 5 mIU/mL   Rapid urine drug screen (hospital performed)     Status: Abnormal   Collection Time: 02/19/21  2:35 AM  Result Value Ref Range   Opiates NONE DETECTED NONE DETECTED   Cocaine NONE DETECTED NONE DETECTED   Benzodiazepines POSITIVE (A) NONE DETECTED   Amphetamines NONE DETECTED NONE DETECTED   Tetrahydrocannabinol NONE DETECTED NONE DETECTED   Barbiturates NONE DETECTED NONE DETECTED    Comment: (NOTE) DRUG SCREEN FOR MEDICAL PURPOSES ONLY.  IF CONFIRMATION IS NEEDED FOR ANY PURPOSE, NOTIFY LAB WITHIN 5 DAYS.  LOWEST DETECTABLE LIMITS FOR URINE DRUG  SCREEN Drug Class  Cutoff (ng/mL) Amphetamine and metabolites    1000 Barbiturate and metabolites    200 Benzodiazepine                 646 Tricyclics and metabolites     300 Opiates and metabolites        300 Cocaine and metabolites        300 THC                            50 Performed at [facility], [place] [address], [place], [place] [address]   Blood gas, venous (at [facility] and [facility], not at [facility])     Status: Abnormal   Collection Time: 02/19/21  3:29 AM  Result Value Ref Range   pH, Ven 7.429 7.250 - 7.430   pCO2, Ven 27.9 (L) 44.0 - 60.0 mmHg   pO2, Ven 49.9 (H) 32.0 - 45.0 mmHg   Bicarbonate 18.2 (L) 20.0 - 28.0 mmol/L   Acid-base deficit 4.4 (H) 0.0 - 2.0 mmol/L   O2 Saturation 84.3 %   Patient temperature 98.6     Comment: Performed at [facility], [place] [address], [place], [place] [address]  TSH     Status: None   Collection Time: 02/19/21  3:57 AM  Result Value Ref Range   TSH 1.142 0.350 - 4.500 uIU/mL    Comment: Performed by a 3rd Generation assay with a functional sensitivity of <=0.01 uIU/mL. Performed at [facility], [place] [address], [place], [place] [address]   Prolactin     Status: Abnormal   Collection Time: 02/19/21  3:57 AM  Result Value Ref Range   Prolactin 95.9 (H) 4.8 - 23.3 ng/mL    Comment: (NOTE) Performed At: [facility] [place], [place] [id number] Rush Farmer MD [id number]   HIV Antibody (routine testing w rflx)     Status: None   Collection Time: 02/19/21  4:01 AM  Result Value Ref Range   HIV Screen 4th Generation wRfx Non Reactive Non Reactive    Comment: Performed at [facility], [place] [address], [place], [place] [address]  Basic metabolic panel     Status: Abnormal   Collection Time: 02/19/21  4:51 AM  Result Value Ref Range   Sodium 140 135 - 145 mmol/L   Potassium 3.5 3.5 - 5.1 mmol/L   Chloride 107 98 - 111 mmol/L   CO2 21 (L)  22 - 32 mmol/L   Glucose, Bld 91 70 - 99 mg/dL    Comment: Glucose reference range applies only to samples taken after fasting for at least 8 hours.   BUN 6 6 - 20 mg/dL   Creatinine, Ser 0.57 0.44 - 1.00 mg/dL   Calcium 9.3 8.9 - 10.3 mg/dL   GFR, Estimated >60 >60 mL/min    Comment: (NOTE) Calculated using the CKD-EPI Creatinine Equation (2021)    Anion gap 12 5 - 15    Comment: Performed at [facility], [place] [address], [place], [place] [address]  CBC     Status: None   Collection Time: 02/19/21  4:51 AM  Result Value Ref Range   WBC 5.9 4.0 - 10.5 K/uL   RBC 4.26 3.87 - 5.11 MIL/uL   Hemoglobin 13.4 12.0 - 15.0 g/dL   HCT 40.0 36.0 - 46.0 %   MCV 93.9 80.0 - 100.0 fL   MCH 31.5 26.0 -  34.0 pg   MCHC 33.5 30.0 - 36.0 g/dL   RDW 12.2 11.5 - 15.5 %   Platelets 261 150 - 400 K/uL   nRBC 0.0 0.0 - 0.2 %    Comment: Performed at [facility], [place] [address], [place], [place] [address]  Blood gas, venous     Status: Abnormal   Collection Time: 02/19/21  4:51 AM  Result Value Ref Range   pH, Ven 7.304 7.250 - 7.430   pCO2, Ven 45.2 44.0 - 60.0 mmHg   pO2, Ven 45.0 32.0 - 45.0 mmHg   Bicarbonate 21.8 20.0 - 28.0 mmol/L   Acid-base deficit 4.1 (H) 0.0 - 2.0 mmol/L   O2 Saturation 72.8 %   Patient temperature 98.6     Comment: Performed at [facility], [place] [address], [place], [place] [address]  Triglycerides     Status: None   Collection Time: 02/20/21  6:01 AM  Result Value Ref Range   Triglycerides 68 <150 mg/dL    Comment: Performed at [facility], [place] [address] [place], [place] [address]    Current Facility-Administered Medications  Medication Dose Route Frequency Provider Last Rate Last Admin  . docusate sodium (COLACE) capsule 100 mg  100 mg Oral BID PRN Maryjane Hurter, MD      . docusate sodium (COLACE) capsule 100 mg  100 mg Oral BID Freddi Starr, MD   100 mg at 02/19/21 2229  .  folic acid (FOLVITE) tablet 1 mg  1 mg Oral Daily Maryjane Hurter, MD   1 mg at 02/19/21 1302  . LORazepam (ATIVAN) tablet 1-4 mg  1-4 mg Oral Q1H PRN Maryjane Hurter, MD       Or  . LORazepam (ATIVAN) injection 1-4 mg  1-4 mg Intravenous Q1H PRN Maryjane Hurter, MD   1 mg at 02/19/21 2253  . multivitamin with minerals tablet 1 tablet  1 tablet Oral Daily Maryjane Hurter, MD   1 tablet at 02/19/21 1301  . polyethylene glycol (MIRALAX / GLYCOLAX) packet 17 g  17 g Per Tube Daily Maryjane Hurter, MD      . polyethylene glycol [facility] / GLYCOLAX) packet 17 g  17 g Oral Daily PRN Maryjane Hurter, MD      . thiamine tablet 100 mg  100 mg Oral Daily Maryjane Hurter, MD   100 mg at 02/19/21 1302   Or  . thiamine (B-1) injection 100 mg  100 mg Intravenous Daily Maryjane Hurter, MD        Musculoskeletal: Strength & Muscle Tone: not tested Gait & Station: normal Patient leans: N/A    Psychiatric Specialty Exam:  Presentation  General Appearance: Casual; Appropriate for Environment  Eye Contact:Minimal  Speech:Clear and Coherent  Speech Volume:Decreased  Handedness:Right   Mood and Affect  Mood:Depressed  Affect:Tearful; Constricted   Thought Process  Thought Processes:Coherent; Linear  Descriptions of Associations:Intact  Orientation:Full (Time, Place and Person)  Thought Content:Logical  History of Schizophrenia/Schizoaffective disorder:No data recorded Duration of Psychotic Symptoms:No data recorded Hallucinations:Hallucinations: None  Ideas of Reference:None  Suicidal Thoughts:Suicidal Thoughts: Yes, Active SI Active Intent and/or Plan: With Intent; With Plan  Homicidal Thoughts:Homicidal Thoughts: No   Sensorium  Memory:Immediate Good; Recent Good; Remote Good  Judgment:Poor  Insight:Poor   Executive Functions  Concentration:Fair  Attention Span:Fair  [place]  Language:Good   Psychomotor  Activity  Psychomotor Activity:Psychomotor Activity: Psychomotor Retardation  Assets  Assets:Desire for Improvement; Communication Skills   Sleep  Sleep:Sleep: Poor   Physical Exam: Physical Exam Psychiatric:        Attention and Perception: Attention normal.        Mood and Affect: Mood is depressed. Affect is flat.        Speech: Speech normal.        Behavior: Behavior is withdrawn. Behavior is cooperative.        Thought Content: Thought content includes suicidal ideation. Thought content includes suicidal plan.        Cognition and Memory: Cognition and memory normal.        Judgment: Judgment is impulsive.    Review of Systems  HENT: Negative.   Eyes: Negative.   Respiratory: Negative.   Cardiovascular: Negative.   Psychiatric/Behavioral: Positive for depression, substance abuse and suicidal ideas. The patient has insomnia.    Blood pressure 115/78, pulse 73, temperature 98.8 F (37.1 C), resp. rate 16, height 5\' 2"  (1.575 m), weight 52 kg, SpO2 100 %. Body mass index is 20.97 kg/m.  Treatment Plan Summary: 20 year old female with history of depression, anxiety who was admitted following suicide attempt by self cutting secondary to multiple life stressors. Currently, patient is unable to contract for safety, she will benefit from inpatient psychiatric admission after she is medically stable  Recommendations: -Continue 1:1 sitter for safety -Consider starting patient on Prozac 10 mg daily for depression, Trazodone 50 mg at bedtime as needed when she is able to swallow. -Continue Lorazepam alcohol withdrawal protocol -Consider social worker consult to facilitate inpatient psychiatric admission  Disposition: Recommend psychiatric Inpatient admission when medically cleared. Supportive therapy provided about ongoing stressors.  Corena Pilgrim, MD 02/20/2021 1:58 PM

## 2021-02-21 DIAGNOSIS — F332 Major depressive disorder, recurrent severe without psychotic features: Secondary | ICD-10-CM | POA: Diagnosis not present

## 2021-02-21 LAB — BASIC METABOLIC PANEL
Anion gap: 7 (ref 5–15)
BUN: 9 mg/dL (ref 6–20)
CO2: 25 mmol/L (ref 22–32)
Calcium: 9.4 mg/dL (ref 8.9–10.3)
Chloride: 105 mmol/L (ref 98–111)
Creatinine, Ser: 0.6 mg/dL (ref 0.44–1.00)
GFR, Estimated: >60 mL/min (ref >60)
Glucose, Bld: 97 mg/dL (ref 70–99)
Potassium: 3.8 mmol/L (ref 3.5–5.1)
Sodium: 137 mmol/L (ref 135–145)

## 2021-02-21 LAB — MAGNESIUM: Magnesium: 2 mg/dL (ref 1.7–2.4)

## 2021-02-21 LAB — OSMOLALITY: Osmolality: 285 mOsm/kg (ref 275–295)

## 2021-02-21 MED ORDER — FLUOXETINE HCL 20 MG PO CAPS
20.0000 mg | ORAL_CAPSULE | Freq: Every day | ORAL | Status: DC
  Administered 2021-02-22 – 2021-02-23 (×2): 20 mg via ORAL
  Filled 2021-02-21 (×2): qty 1

## 2021-02-21 NOTE — Progress Notes (Signed)
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 Found to be essentially unresponsive on arrival to ED and vomiting. Intubated in ED, concern for aspiration. She self-extubated shortly after.  Admitted under PCCM initially and transferred under [place] on 02/20/2021.  Assessment & Plan:   Principal Problem:   Major depressive disorder, recurrent episode, severe ([place]) Active Problems:   Acute encephalopathy  Acute encephalopathy: Resolved.  She is fully alert and oriented.  CT head unremarkable.  Suicidal ideation: Seen by psychiatry, they recommend inpatient psychiatric admission.  TOC working on that.  Acute hypoxic respiratory failure: Needed to be intubated in the ED however she self extubated herself shortly after and has remained on room air and is comfortable.  Chest x-ray negative for infiltrates.  Alcohol intoxication: Her alcohol level was 153 upon arrival.  No signs of withdrawal.  Continue CIWA protocol.  Patient is medically cleared for discharge to inpatient psych unit.  DVT prophylaxis: SCDs Start: 02/19/21 0334   Code Status: Full Code  Family Communication: Mother and father at the bedside.  Plan of care discussed with patient and both of them.  Status is: Inpatient  Remains inpatient appropriate because:Inpatient level of care appropriate due to severity of illness   Dispo:  Patient From: Home  Planned Disposition: Home  Medically stable for discharge: No          Estimated body mass index is 20.97 kg/m as calculated from the following:   Height as of this encounter: 5\' 2"  (1.575 m).   Weight as of this encounter: 52 kg.      Nutritional status:               Consultants:    Psychiatry  Procedures:   Intubation  Antimicrobials:  Anti-infectives (From admission, onward)   None         Subjective: Seen and examined.  She has no complaints.  Parents at the bedside.  Objective: Vitals:   02/20/21 1505 02/20/21 1902 02/21/21 0300 02/21/21 1354  BP: 125/85 98/61 101/67 100/66  Pulse: 91 69 67 71  Resp: 20  18 18   Temp: 97.6 F (36.4 C) 98.2 F (36.8 C) 97.8 F (36.6 C) 98.2 F (36.8 C)  TempSrc: Oral Oral Oral Oral  SpO2: 100% 100% 100% 100%  Weight:      Height:        Intake/Output Summary (Last 24 hours) at 02/21/2021 1422 Last data filed at 02/21/2021 1012 Gross per 24 hour  Intake 236 ml  Output 1975 ml  Net -1739 ml   Filed Weights   02/19/21 0315 02/19/21 2346  Weight: 52 kg 52 kg    Examination:  General exam: Appears calm and comfortable  Respiratory system: Clear to auscultation. Respiratory effort normal. Cardiovascular system: S1 & S2 heard, RRR. No JVD, murmurs, rubs, gallops or clicks. No pedal edema. Gastrointestinal system: Abdomen is nondistended, soft and nontender. No organomegaly or masses felt. Normal bowel sounds heard. Central nervous system: Alert and oriented. No focal neurological deficits. Extremities: Symmetric 5 x 5 power. Skin: No rashes, lesions or ulcers.  Psychiatry: Judgement and insight appear poor,. Mood & affect flat.   Data Reviewed: I have personally reviewed following labs and imaging studies  CBC: Recent Labs  Lab 02/19/21  0136 02/19/21 0451  WBC 6.2 5.9  HGB 13.2 13.4  HCT 39.0 40.0  MCV 93.8 93.9  PLT 280 161   Basic Metabolic Panel: Recent Labs  Lab 02/19/21 0134 02/19/21 0136 02/19/21 0451 02/21/21 0624  NA  --  140 140 137  K  --  3.3* 3.5 3.8  CL  --  108 107 105  CO2  --  25 21* 25  GLUCOSE  --  76 91 97  BUN  --  8 6 9   CREATININE  --  0.78 0.57 0.60  CALCIUM  --  9.3 9.3 9.4  MG 2.2  --   --  2.0  PHOS 3.4  --   --   --    GFR: Estimated Creatinine  Clearance: 88 mL/min (by C-G formula based on SCr of 0.6 mg/dL). Liver Function Tests: Recent Labs  Lab 02/19/21 0136  AST 18  ALT 10  ALKPHOS 76  BILITOT 0.4  PROT 7.2  ALBUMIN 4.2   No results for input(s): LIPASE, AMYLASE in the last 168 hours. No results for input(s): AMMONIA in the last 168 hours. Coagulation Profile: No results for input(s): INR, PROTIME in the last 168 hours. Cardiac Enzymes: No results for input(s): CKTOTAL, CKMB, CKMBINDEX, TROPONINI in the last 168 hours. BNP (last 3 results) No results for input(s): PROBNP in the last 8760 hours. HbA1C: No results for input(s): HGBA1C in the last 72 hours. CBG: No results for input(s): GLUCAP in the last 168 hours. Lipid Profile: Recent Labs    02/20/21 0601  TRIG 68   Thyroid Function Tests: Recent Labs    02/19/21 0357  TSH 1.142   Anemia Panel: No results for input(s): VITAMINB12, FOLATE, FERRITIN, TIBC, IRON, RETICCTPCT in the last 72 hours. Sepsis Labs: No results for input(s): PROCALCITON, LATICACIDVEN in the last 168 hours.  Recent Results (from the past 240 hour(s))  Resp Panel by RT-PCR (Flu A&B, Covid) Nasopharyngeal Swab     Status: None   Collection Time: 02/19/21  1:31 AM   Specimen: Nasopharyngeal Swab; Nasopharyngeal(NP) swabs in vial transport medium  Result Value Ref Range Status   SARS Coronavirus 2 by RT PCR NEGATIVE NEGATIVE Final    Comment: (NOTE) SARS-CoV-2 target nucleic acids are NOT DETECTED.  The SARS-CoV-2 RNA is generally detectable in upper respiratory specimens during the acute phase of infection. The lowest concentration of SARS-CoV-2 viral copies this assay can detect is 138 copies/mL. A negative result does not preclude SARS-Cov-2 infection and should not be used as the sole basis for treatment or other patient management decisions. A negative result may occur with  improper specimen collection/handling, submission of specimen other than nasopharyngeal swab,  presence of viral mutation(s) within the areas targeted by this assay, and inadequate number of viral copies(<138 copies/mL). A negative result must be combined with clinical observations, patient history, and epidemiological information. The expected result is Negative.  Fact Sheet for Patients:  EntrepreneurPulse.com.au  Fact Sheet for Healthcare Providers:  IncredibleEmployment.be  This test is no t yet approved or cleared by the [place] FDA and  has been authorized for detection and/or diagnosis of SARS-CoV-2 by FDA under an Emergency Use Authorization (EUA). This EUA will remain  in effect (meaning this test can be used) for the duration of the COVID-19 declaration under Section 564(b)(1) of the Act, 21 U.S.C.section 360bbb-3(b)(1), unless the authorization is terminated  or revoked sooner.       Influenza A by PCR NEGATIVE NEGATIVE Final  Influenza B by PCR NEGATIVE NEGATIVE Final    Comment: (NOTE) The Xpert Xpress SARS-CoV-2/FLU/RSV plus assay is intended as an aid in the diagnosis of influenza from Nasopharyngeal swab specimens and should not be used as a sole basis for treatment. Nasal washings and aspirates are unacceptable for Xpert Xpress SARS-CoV-2/FLU/RSV testing.  Fact Sheet for Patients: EntrepreneurPulse.com.au  Fact Sheet for Healthcare Providers: IncredibleEmployment.be  This test is not yet approved or cleared by the [place] FDA and has been authorized for detection and/or diagnosis of SARS-CoV-2 by FDA under an Emergency Use Authorization (EUA). This EUA will remain in effect (meaning this test can be used) for the duration of the COVID-19 declaration under Section 564(b)(1) of the Act, 21 U.S.C. section 360bbb-3(b)(1), unless the authorization is terminated or revoked.  Performed at [facility], [place] [address] [place], [place] [address]        Radiology Studies: No results found.  Scheduled Meds: . docusate sodium  100 mg Oral BID  . [START ON 02/22/2021] FLUoxetine  20 mg Oral Daily  . folic acid  1 mg Oral Daily  . multivitamin with minerals  1 tablet Oral Daily  . polyethylene glycol  17 g Per Tube Daily  . thiamine  100 mg Oral Daily   Or  . thiamine  100 mg Intravenous Daily   Continuous Infusions:   LOS: 2 days   Time spent: 28 minutes   Darliss Cheney, MD [facility]  02/21/2021, 2:22 PM   How to contact the [facility] Attending or Consulting provider [place] or covering provider during after hours [place], for this patient?  1. Check the care team in [facility] and look for a) attending/consulting TRH provider listed and b) the [facility] team listed. Page or secure chat [phone]. 2. Log into [email or web address] and use [place]'s universal password to access. If you do not have the password, please contact the hospital operator. 3. Locate the [facility] provider you are looking for under [facility] and page to a number that you can be directly reached. 4. If you still have difficulty reaching the provider, please page the [facility] (Director on Call) for the Hospitalists listed on amion for assistance.

## 2021-02-21 NOTE — Consult Note (Signed)
Patient is a 20 year old female with nonfunctioning pituitary adenoma, headaches, generalized anxiety disorder, depression who presented to the hospital after a suicide attempt. Patient seen and assessed and continues to meet inpatient criteria.  She continues to endorse suicidal thoughts, related to her depressive symptoms, unemployment, financial issues, psychosocial factors.  Both parents were present today, this provider was able to address all questions and concerns from a psychiatric standpoint.  We reviewed inpatient admission, course of expectation as it relates to medication management, adjuvant therapy, and importance of outpatient follow-up once patient is discharged from inpatient hospital.  Patient was recently assessed in its entirety yesterday by psychiatrist Dr. Darleene Cleaver, please see full psychiatric evaluation.  -Patient continues to meet inpatient criteria at this time. -Will increase Prozac 20 mg p.o. daily for depression, continue trazodone 50 mg p.o. nightly as needed. -Continue working closely with social work to facilitate inpatient psychiatric admission.  It appears contact has already been made with call behavioral health Hospital prior documentation placed in chart today.  Parents are hoping patient is placed close by, as they also have medical conditions that will not allow them to travel long distance for visitation.

## 2021-02-21 NOTE — TOC Initial Note (Signed)
Transition of Care [facility]) - Initial/Assessment Note    Patient Details  Name: Ellice Boultinghouse MRN: [id number] Date of Birth: 2000/11/13  Transition [facility] Care [facility]) CM/SW Contact:    Trish Mage, LCSW Phone Number: 02/21/2021, 10:16 AM  Clinical Narrative:    MD confirmed patient is medically stable.  Ms Espin's information was sent to [facility], [facility] for review. TOC will continue to follow during the course of hospitalization.                Expected Discharge Plan: Psychiatric Hospital Barriers to Discharge: Psych Bed not available   Patient Goals and CMS Choice        Expected Discharge Plan and Services Expected Discharge Plan: [facility]         Expected Discharge Date: 02/21/21                                    Prior Living Arrangements/Services                       Activities of Daily Living      Permission Sought/Granted                  Emotional Assessment              Admission diagnosis:  Respiratory arrest ([place]) [R09.2] Suicidal ideation [R45.851] Acute encephalopathy [G93.40] Patient Active Problem List   Diagnosis Date Noted  . Major depressive disorder, recurrent episode, severe ([place]) 02/20/2021  . Acute encephalopathy 02/19/2021   PCP:  Tester, Micah Flesher, PA-C Pharmacy:   [facility] [place], [place] - [address] [place] [address] Phone: [phone] Fax: [phone]     Social Determinants of Health (SDOH) Interventions    Readmission Risk Interventions No flowsheet data found.

## 2021-02-22 LAB — RESP PANEL BY RT-PCR (FLU A&B, COVID) ARPGX2
Influenza A by PCR: NEGATIVE
Influenza B by PCR: NEGATIVE
SARS Coronavirus 2 by RT PCR: NEGATIVE

## 2021-02-22 MED ORDER — ONDANSETRON HCL 4 MG/2ML IJ SOLN
4.0000 mg | Freq: Four times a day (QID) | INTRAMUSCULAR | Status: DC | PRN
  Administered 2021-02-22 (×2): 4 mg via INTRAVENOUS
  Filled 2021-02-22 (×2): qty 2

## 2021-02-22 NOTE — Progress Notes (Signed)
Patient requesting work note as she was supposed to start a new job this week but is unable to due to hospitalization. Dr. Doristine Bosworth notified. Will follow up later today.

## 2021-02-22 NOTE — Progress Notes (Signed)
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 Found to be essentially unresponsive on arrival to ED and vomiting. Intubated in ED, concern for aspiration. She self-extubated shortly after.  Admitted under PCCM initially and transferred under [place] on 02/20/2021.  Seen by psychiatry, needs inpatient psych admission.  Medically cleared and waiting for [facility] bed.   Assessment & Plan:   Principal Problem:   Major depressive disorder, recurrent episode, severe ([place]) Active Problems:   Acute encephalopathy  Acute encephalopathy: Resolved.  She is fully alert and oriented.  CT head unremarkable.  Suicidal ideation: Seen by psychiatry, they recommend inpatient psychiatric admission.  TOC working on that.  She is medically cleared.  Acute hypoxic respiratory failure: Needed to be intubated in the ED however she self extubated herself shortly after and has remained on room air and is comfortable.  Chest x-ray negative for infiltrates.  Alcohol intoxication: Her alcohol level was 153 upon arrival.  No signs of withdrawal.  Continue CIWA protocol.  DVT prophylaxis: SCDs Start: 02/19/21 0334   Code Status: Full Code  Family Communication: Discussed with parents at the bedside yesterday.  No family present at bedside.  Status is: Inpatient  Remains inpatient appropriate because:Inpatient level of care appropriate due to severity of illness   Dispo:  Patient From: Home  Planned Disposition: [place]  Medically stable for discharge: YES         Estimated body mass index is 20.97 kg/m as calculated from the following:   Height as of this encounter: 5\' 2"  (1.575 m).   Weight as of this encounter: 52 kg.      Nutritional status:                Consultants:   Psychiatry  Procedures:   Intubation  Antimicrobials:  Anti-infectives (From admission, onward)   None         Subjective: Seen and examined.  Sleeping.  Not willing to wake up and talk much.  Denied having any complaint.  Objective: Vitals:   02/21/21 0300 02/21/21 1354 02/21/21 2047 02/22/21 0449  BP: 101/67 100/66 105/64 109/66  Pulse: 67 71 64 64  Resp: 18 18 16 14   Temp: 97.8 F (36.6 C) 98.2 F (36.8 C) 98.8 F (37.1 C) 98.9 F (37.2 C)  TempSrc: Oral Oral    SpO2: 100% 100% 100% 97%  Weight:      Height:       No intake or output data in the 24 hours ending 02/22/21 1335 Filed Weights   02/19/21 0315 02/19/21 2346  Weight: 52 kg 52 kg    Examination:  General exam: Sleepy Respiratory system: Clear to auscultation. Respiratory effort normal. Cardiovascular system: S1 & S2 heard, RRR. No JVD, murmurs, rubs, gallops or clicks. No pedal edema. Gastrointestinal system: Abdomen is nondistended, soft and nontender. No organomegaly or masses felt. Normal bowel sounds heard. Central nervous system: Sleepy  Extremities: Symmetric 5 x 5 power. Skin: No rashes, lesions or ulcers.  Psychiatry: Judgement and insight appear poor   Data Reviewed: I have personally reviewed following labs and imaging studies  CBC: Recent Labs  Lab 02/19/21 0136 02/19/21 0451  WBC 6.2 5.9  HGB 13.2 13.4  HCT 39.0 40.0  MCV 93.8 93.9  PLT 280  562   Basic Metabolic Panel: Recent Labs  Lab 02/19/21 0134 02/19/21 0136 02/19/21 0451 02/21/21 0624  NA  --  140 140 137  K  --  3.3* 3.5 3.8  CL  --  108 107 105  CO2  --  25 21* 25  GLUCOSE  --  76 91 97  BUN  --  8 6 9   CREATININE  --  0.78 0.57 0.60  CALCIUM  --  9.3 9.3 9.4  MG 2.2  --   --  2.0  PHOS 3.4  --   --   --    GFR: Estimated Creatinine Clearance: 88 mL/min (by C-G formula based on SCr of 0.6 mg/dL). Liver Function Tests: Recent Labs  Lab 02/19/21 0136  AST 18   ALT 10  ALKPHOS 76  BILITOT 0.4  PROT 7.2  ALBUMIN 4.2   No results for input(s): LIPASE, AMYLASE in the last 168 hours. No results for input(s): AMMONIA in the last 168 hours. Coagulation Profile: No results for input(s): INR, PROTIME in the last 168 hours. Cardiac Enzymes: No results for input(s): CKTOTAL, CKMB, CKMBINDEX, TROPONINI in the last 168 hours. BNP (last 3 results) No results for input(s): PROBNP in the last 8760 hours. HbA1C: No results for input(s): HGBA1C in the last 72 hours. CBG: No results for input(s): GLUCAP in the last 168 hours. Lipid Profile: Recent Labs    02/20/21 0601  TRIG 68   Thyroid Function Tests: No results for input(s): TSH, T4TOTAL, FREET4, T3FREE, THYROIDAB in the last 72 hours. Anemia Panel: No results for input(s): VITAMINB12, FOLATE, FERRITIN, TIBC, IRON, RETICCTPCT in the last 72 hours. Sepsis Labs: No results for input(s): PROCALCITON, LATICACIDVEN in the last 168 hours.  Recent Results (from the past 240 hour(s))  Resp Panel by RT-PCR (Flu A&B, Covid) Nasopharyngeal Swab     Status: None   Collection Time: 02/19/21  1:31 AM   Specimen: Nasopharyngeal Swab; Nasopharyngeal(NP) swabs in vial transport medium  Result Value Ref Range Status   SARS Coronavirus 2 by RT PCR NEGATIVE NEGATIVE Final    Comment: (NOTE) SARS-CoV-2 target nucleic acids are NOT DETECTED.  The SARS-CoV-2 RNA is generally detectable in upper respiratory specimens during the acute phase of infection. The lowest concentration of SARS-CoV-2 viral copies this assay can detect is 138 copies/mL. A negative result does not preclude SARS-Cov-2 infection and should not be used as the sole basis for treatment or other patient management decisions. A negative result may occur with  improper specimen collection/handling, submission of specimen other than nasopharyngeal swab, presence of viral mutation(s) within the areas targeted by this assay, and inadequate number of  viral copies(<138 copies/mL). A negative result must be combined with clinical observations, patient history, and epidemiological information. The expected result is Negative.  Fact Sheet for Patients:  EntrepreneurPulse.com.au  Fact Sheet for Healthcare Providers:  IncredibleEmployment.be  This test is no t yet approved or cleared by the [place] FDA and  has been authorized for detection and/or diagnosis of SARS-CoV-2 by FDA under an Emergency Use Authorization (EUA). This EUA will remain  in effect (meaning this test can be used) for the duration of the COVID-19 declaration under Section 564(b)(1) of the Act, 21 U.S.C.section 360bbb-3(b)(1), unless the authorization is terminated  or revoked sooner.       Influenza A by PCR NEGATIVE NEGATIVE Final   Influenza B by PCR NEGATIVE NEGATIVE Final    Comment: (NOTE) The Xpert Xpress SARS-CoV-2/FLU/RSV plus assay is  intended as an aid in the diagnosis of influenza from Nasopharyngeal swab specimens and should not be used as a sole basis for treatment. Nasal washings and aspirates are unacceptable for Xpert Xpress SARS-CoV-2/FLU/RSV testing.  Fact Sheet for Patients: EntrepreneurPulse.com.au  Fact Sheet for Healthcare Providers: IncredibleEmployment.be  This test is not yet approved or cleared by the [place] FDA and has been authorized for detection and/or diagnosis of SARS-CoV-2 by FDA under an Emergency Use Authorization (EUA). This EUA will remain in effect (meaning this test can be used) for the duration of the COVID-19 declaration under Section 564(b)(1) of the Act, 21 U.S.C. section 360bbb-3(b)(1), unless the authorization is terminated or revoked.  Performed at [facility], [place] [address] [place], [place] [address]       Radiology Studies: No results found.  Scheduled Meds: . docusate sodium  100 mg Oral BID   . FLUoxetine  20 mg Oral Daily  . folic acid  1 mg Oral Daily  . multivitamin with minerals  1 tablet Oral Daily  . thiamine  100 mg Oral Daily   Or  . thiamine  100 mg Intravenous Daily   Continuous Infusions:   LOS: 3 days   Time spent: 27 minutes   Darliss Cheney, MD [facility]  02/22/2021, 1:35 PM   How to contact the [facility] Attending or Consulting provider [place] or covering provider during after hours [place], for this patient?  1. Check the care team in [facility] and look for a) attending/consulting TRH provider listed and b) the [facility] team listed. Page or secure chat [phone]. 2. Log into [email or web address] and use [place]'s universal password to access. If you do not have the password, please contact the hospital operator. 3. Locate the [facility] provider you are looking for under [facility] and page to a number that you can be directly reached. 4. If you still have difficulty reaching the provider, please page the [facility] (Director on Call) for the Hospitalists listed on amion for assistance.

## 2021-02-22 NOTE — TOC Progression Note (Addendum)
Transition of Care [facility]) - Progression Note    Patient Details  Name: Elizabeth Bell MRN: [id number] Date of Birth: [birth year]  Transition of Care [facility]) CM/SW [place], [place] Phone Number: 02/22/2021, 3:48 PM  Clinical Narrative:   Patient again referred to [facility] and [facility] for beds.  Both will review AFTER taking care of ED, [facility] referrals.  Information sent to [facility] TOC will continue to follow during the course of hospitalization.  Addendum:  Patient accepted at [facility] tomorrow, 02/23/21, after AM.  MD and nursing alerted. I asked RN to tell her to line up transportation if she prefers to have family take her.      Expected Discharge Plan: Psychiatric Hospital Barriers to Discharge: Psych Bed not available  Expected Discharge Plan and Services Expected Discharge Plan: [facility]         Expected Discharge Date: 02/21/21                                     Social Determinants of Health (SDOH) Interventions    Readmission Risk Interventions No flowsheet data found.

## 2021-02-22 NOTE — BH Assessment (Addendum)
Disposition:   Upon chart review, patient accepted to [facility] pending negative PCR test. Test results noted in EPIC  Negative. Disposition Counselor notified [facility] of the results.   Received updates from [place] that patient has been accepted to [facility] (Adult Unit) after 11pm 02/22/2021.   The accepting provider is Priscille Loveless, NP. The attending provider is Dr. Mallie Darting.  Room assignment is 306-2. Nurse report [phone]. Nursing to have patient sign a voluntary admission form and fax to [phone]. Nursing to also arrange patient transport from [facility] to [facility].   Patient's nurse Jolaine Artist [employer], RN), provided disposition updates. However, patient's nurse states that patient will not be transferred until tomorrow. Clinician re-iterated that patient's bed is ready tonight. Nursing stated that she tried to explain to the on-call provider but he said, "He has talked to dayshift attending, so he can't do it tonight". [place] AC provided update.  Due to patient not being able to transfer at the scheduled time, 11pm, nursing advised that [facility] will need to re-contact Disposition Social Worker to insure the [facility] bed 306-2 is still available and to further coordinate patient's transfer to [facility] adult unit.

## 2021-02-22 NOTE — Progress Notes (Signed)
Work note printed and handed to patient.

## 2021-02-22 NOTE — Progress Notes (Signed)
Received call from [facility] who stated they had a bed available for this patient tonight. [place] requesting COVID PCR and voluntary consent signed. Patient notified of news and willing to proceed with this rather than going to [facility]. Parents notified by patient.

## 2021-02-22 NOTE — Progress Notes (Signed)
Instructed by Carolynn Sayers to call Lyndee Leo at [facility]. Called and answered Claire's questions. Per Lyndee Leo, patient can be accepted tomorrow morning after 9am. Patient and patient's parents made aware and agree with plan of care. Patient's parents will be driving patient to [employer].

## 2021-02-22 NOTE — Progress Notes (Addendum)
Rembert visited pt. per [facility] for support in light of pt.'s suicidal ideation.  Pt. awake and sitting up in bed w/NT sitter and parents at bedside.  Pt. shared she came to the hospital for "SI" and that she had blacked out and that this episode occurred exactly a month after someone close to her was put in prison; she seemed hesitant to share many details but suggested [place] pray for her, requesting prayer for peace; parents also requested prayer.  [facility] offered prayer for pt.'s sense of divine presence and for peace.  Family grateful for visit and support; pt. and family are aware of chaplains' availability if needed.  Lindaann Pascal PRN Chaplain Pager: [phone]

## 2021-02-23 ENCOUNTER — Other Ambulatory Visit: Payer: Self-pay

## 2021-02-23 ENCOUNTER — Inpatient Hospital Stay (HOSPITAL_COMMUNITY)
Admission: AD | Admit: 2021-02-23 | Discharge: 2021-02-25 | DRG: 885 | Disposition: A | Discharge status: Home / Self Care | Payer: BC Managed Care – PPO | Source: Intra-hospital | Attending: Behavioral Health | Admitting: Behavioral Health

## 2021-02-23 ENCOUNTER — Encounter (HOSPITAL_COMMUNITY): Payer: Self-pay | Admitting: Family

## 2021-02-23 DIAGNOSIS — J45909 Unspecified asthma, uncomplicated: Secondary | ICD-10-CM | POA: Diagnosis present

## 2021-02-23 DIAGNOSIS — Z818 Family history of other mental and behavioral disorders: Secondary | ICD-10-CM | POA: Diagnosis not present

## 2021-02-23 DIAGNOSIS — Z79899 Other long term (current) drug therapy: Secondary | ICD-10-CM | POA: Diagnosis not present

## 2021-02-23 DIAGNOSIS — F332 Major depressive disorder, recurrent severe without psychotic features: Secondary | ICD-10-CM | POA: Diagnosis not present

## 2021-02-23 DIAGNOSIS — R45851 Suicidal ideations: Secondary | ICD-10-CM | POA: Diagnosis present

## 2021-02-23 DIAGNOSIS — Z9152 Personal history of nonsuicidal self-harm: Secondary | ICD-10-CM | POA: Diagnosis not present

## 2021-02-23 DIAGNOSIS — Z7951 Long term (current) use of inhaled steroids: Secondary | ICD-10-CM | POA: Diagnosis not present

## 2021-02-23 MED ORDER — FLUTICASONE PROPIONATE HFA 44 MCG/ACT IN AERO
2.0000 | INHALATION_SPRAY | Freq: Two times a day (BID) | RESPIRATORY_TRACT | Status: DC
  Administered 2021-02-23 – 2021-02-25 (×4): 2 via RESPIRATORY_TRACT
  Filled 2021-02-23 (×2): qty 10.6

## 2021-02-23 MED ORDER — FOLIC ACID 1 MG PO TABS: 1.0000 mg | ORAL_TABLET | Freq: Every day | ORAL | Status: DC

## 2021-02-23 MED ORDER — ACETAMINOPHEN 325 MG PO TABS
650.0000 mg | ORAL_TABLET | Freq: Four times a day (QID) | ORAL | Status: DC | PRN
  Administered 2021-02-23 – 2021-02-25 (×3): 650 mg via ORAL
  Filled 2021-02-23 (×3): qty 2

## 2021-02-23 MED ORDER — ADULT MULTIVITAMIN W/MINERALS CH
1.0000 | ORAL_TABLET | Freq: Every day | ORAL | Status: DC
  Administered 2021-02-24 – 2021-02-25 (×2): 1 via ORAL
  Filled 2021-02-23 (×5): qty 1

## 2021-02-23 MED ORDER — TRAZODONE HCL 50 MG PO TABS
50.0000 mg | ORAL_TABLET | Freq: Every evening | ORAL | Status: DC | PRN
Start: 2021-02-23 — End: 2021-02-25
  Administered 2021-02-23 (×2): 50 mg via ORAL
  Filled 2021-02-23 (×2): qty 1

## 2021-02-23 MED ORDER — THIAMINE HCL 100 MG PO TABS
100.0000 mg | ORAL_TABLET | Freq: Every day | ORAL | Status: DC
  Administered 2021-02-23 – 2021-02-25 (×3): 100 mg via ORAL
  Filled 2021-02-23 (×5): qty 1

## 2021-02-23 MED ORDER — FLUOXETINE HCL 20 MG PO CAPS
20.0000 mg | ORAL_CAPSULE | Freq: Every day | ORAL | Status: DC
  Administered 2021-02-23 – 2021-02-25 (×3): 20 mg via ORAL
  Filled 2021-02-23 (×6): qty 1

## 2021-02-23 MED ORDER — ALBUTEROL SULFATE HFA 108 (90 BASE) MCG/ACT IN AERS: 1.0000 | INHALATION_SPRAY | Freq: Four times a day (QID) | RESPIRATORY_TRACT | Status: DC | PRN

## 2021-02-23 MED ORDER — BACLOFEN 10 MG PO TABS: 10.0000 mg | ORAL_TABLET | Freq: Two times a day (BID) | ORAL | Status: DC | PRN

## 2021-02-23 MED ORDER — FLUOXETINE HCL 20 MG PO CAPS: 20.0000 mg | ORAL_CAPSULE | Freq: Every day | ORAL | 3 refills | Status: DC

## 2021-02-23 MED ORDER — FOLIC ACID 1 MG PO TABS
1.0000 mg | ORAL_TABLET | Freq: Every day | ORAL | Status: DC
  Administered 2021-02-23 – 2021-02-25 (×3): 1 mg via ORAL
  Filled 2021-02-23 (×5): qty 1

## 2021-02-23 MED ORDER — ALUM & MAG HYDROXIDE-SIMETH 200-200-20 MG/5ML PO SUSP: 30.0000 mL | ORAL | Status: DC | PRN

## 2021-02-23 MED ORDER — TRAZODONE HCL 50 MG PO TABS: 50.0000 mg | ORAL_TABLET | Freq: Every evening | ORAL | Status: DC | PRN

## 2021-02-23 MED ORDER — MAGNESIUM HYDROXIDE 400 MG/5ML PO SUSP: 30.0000 mL | Freq: Every day | ORAL | Status: DC | PRN

## 2021-02-23 MED ORDER — THIAMINE HCL 100 MG PO TABS: 100.0000 mg | ORAL_TABLET | Freq: Every day | ORAL | Status: DC

## 2021-02-23 MED ORDER — HYDROXYZINE HCL 25 MG PO TABS
25.0000 mg | ORAL_TABLET | Freq: Three times a day (TID) | ORAL | Status: DC | PRN
  Administered 2021-02-24: 25 mg via ORAL
  Filled 2021-02-23: qty 1

## 2021-02-23 NOTE — Progress Notes (Signed)
Patient ID: Elizabeth Bell, female   DOB: 2001/09/20, 20 y.o.   MRN: [id number] Patient presents voluntarily with recent alcohol intoxication which precipitated her admission to ED. Reports that she ingested up to 3 bottles of liquor as she was feeling hopeless, missing her boyfriend who is in jail. She reports no history of acute mental illness but also reports that sister suffers from depression. Her aunt has Bipolar Disorder. Patient reports that she has been diagnosed with ADHD in the past. Medication history indicate past use of antidepressants. She presents with superficial cuts on the right wrist and states "at least I cut myself, I don't cut others". Patient reports that she currently lives alone but has supportive parents. She reports that she is a daily vaper  And not ready to quit. Patient was admitted and oriented to the unit. Safety precautions initiated.

## 2021-02-23 NOTE — Progress Notes (Signed)
MD order to discharge  Discharge instructions reviewed with pt and her parents. All verbalize understanding.   D/C in wheelchair accompanied by [employer] cabin crew.  Parents to take her to behavior health via POV

## 2021-02-23 NOTE — [facility] Group Notes (Signed)
Topic: Emotions  Due to the acuity and complex discharge plans, group was not held. Patient was provided therapeutic worksheets and asked to meet with CSW as needed.  Toney Reil, [place] Worker [employer]

## 2021-02-23 NOTE — Progress Notes (Signed)
On- call provider was paged about patient's discharge orders, and medication reconciliation. Provider addressed that patient will be discharged in the a.m. and the discharge will be performed by the daytime provider. Charge Nurse, Anibal Henderson. and counselor was notified. Will make the oncoming nurse aware,

## 2021-02-23 NOTE — Tx Team (Signed)
Initial Treatment Plan 02/23/2021 2:03 PM Elizabeth Bell YGE:[id number]    PATIENT STRESSORS: Loss of relationship Medication change or noncompliance Substance abuse   PATIENT STRENGTHS: Average or above average intelligence Communication skills General fund of knowledge Motivation for treatment/growth Supportive family/friends   PATIENT IDENTIFIED PROBLEMS: Anxiety/worrying  Alcohol use  Medication non compliance                 DISCHARGE CRITERIA:  Ability to meet basic life and health needs Improved stabilization in mood, thinking, and/or behavior Medical problems require only outpatient monitoring Motivation to continue treatment in a less acute level of care Verbal commitment to aftercare and medication compliance  PRELIMINARY DISCHARGE PLAN: Outpatient therapy Return to previous living arrangement Return to previous work or school arrangements  PATIENT/FAMILY INVOLVEMENT: This treatment plan has been presented to and reviewed with the patient, Elizabeth Bell,.  The patient has been given the opportunity to ask questions and make suggestions.  Ronelle Nigh, RN 02/23/2021, 2:03 PM

## 2021-02-23 NOTE — Discharge Instructions (Signed)
Suicidal Feelings: How to Help Yourself Suicide is when you end your own life. There are many things you can do to help yourself feel better when struggling with these feelings. Many services and people are available to support you and others who struggle with similar feelings.  If you ever feel like you may hurt yourself or others, or have thoughts about taking your own life, get help right away. To get help:  Call your local emergency services (911 in the [place]  [facility] [place] [address]'s health and human services helpline (211 in the [place]  Go to your nearest emergency department.  Call a suicide hotline to speak with a trained counselor. The following suicide hotlines are available in the [place]: ? 1-800-273-TALK 607-645-7580). ? 1-800-SUICIDE 415-619-8040). ? 951 706 1360. This is a hotline for Spanish speakers. ? 563-449-6428. This is a hotline for TTY users. ? 1-866-4-U-TREVOR (901)292-1798). This is a hotline for lesbian, gay, bisexual, transgender, or questioning youth. ? For a list of hotlines in [place], visit ParkingAffiliatePrograms.se.html  Contact a crisis center or a local suicide prevention center. To find a crisis center or suicide prevention center: ? Call your local hospital, clinic, community service organization, mental health center, social service provider, or health department. Ask for help with connecting to a crisis center. ? For a list of crisis centers in the [place], visit: suicidepreventionlifeline.org ? For a list of crisis centers in [place], visit: suicideprevention.ca How to help yourself feel better  Promise yourself that you will not do anything extreme when you have suicidal feelings. Remember the times you have felt hopeful. Many people have gotten through suicidal thoughts and feelings, and you can too. If you have had these feelings before, remind yourself that you can get through them again.  Let  family, friends, teachers, or counselors know how you are feeling. Try not to separate yourself from those who care about you and want to help you. Talk with someone every day, even if you do not feel sociable. Face-to-face conversation is best to help them understand your feelings.  Contact a mental health care provider and work with this person regularly.  Make a safety plan that you can follow during a crisis. Include phone numbers of suicide prevention hotlines, mental health professionals, and trusted friends and family members you can call during an emergency. Save these numbers on your phone.  If you are thinking of taking a lot of medicine, give your medicine to someone who can give it to you as prescribed. If you are on antidepressants and are concerned you will overdose, tell your health care provider so that he or she can give you safer medicines.  Try to stick to your routines and follow a schedule every day. Make self-care a priority.  Make a list of realistic goals, and cross them off when you achieve them. Accomplishments can give you a sense of worth.  Wait until you are feeling better before doing things that you find difficult or unpleasant.  Do things that you have always enjoyed to take your mind off your feelings. Try reading a book, or listening to or playing music. Spending time outside, in nature, may help you feel better.   Follow these instructions at home:  Visit your primary health care provider every year for a checkup.  Work with a mental health care provider as needed.  Eat a well-balanced diet, and eat regular meals.  Get plenty of rest.  Exercise if you are able. Just 30 minutes of  exercise each day can help you feel better.  Take over-the-counter and prescription medicines only as told by your health care provider. Ask your mental health care provider about the possible side effects of any medicines you are taking.  Do not use alcohol or drugs, and  remove these substances from your home.  Remove weapons, poisons, knives, and other deadly items from your home.   General recommendations  Keep your living space well lit.  When you are feeling well, write yourself a letter with tips and support that you can read when you are not feeling well.  Remember that life's difficulties can be sorted out with help. Conditions can be treated, and you can learn behaviors and ways of thinking that will help you. Where to find more information  National Suicide Prevention Lifeline: www.suicidepreventionlifeline.org  Hopeline: www.hopeline.[place] for Suicide Prevention: PromotionalLoans.co.za  The [employer] (for lesbian, gay, bisexual, transgender, or questioning youth): www.thetrevorproject.[employer] of Mental Health: http://bridges.com/ Contact a health care provider if:  You feel as though you are a burden to others.  You feel agitated, angry, vengeful, or have extreme mood swings.  You have withdrawn from family and friends. Get help right away if:  You are talking about suicide or wishing to die.  You start making plans for how to commit suicide.  You feel that you have no reason to live.  You start making plans for putting your affairs in order, saying goodbye, or giving your possessions away.  You feel guilt, shame, or unbearable pain, and it seems like there is no way out.  You are frequently using drugs or alcohol.  You are engaging in risky behaviors that could lead to death. If you have any of these symptoms, get help right away. Call emergency services, go to your nearest emergency department or crisis center, or call a suicide crisis helpline. Summary  Suicide is when you take your own life.  Promise yourself that you will not do anything extreme when you have suicidal feelings.  Let family, friends, teachers, or counselors know how you are  feeling.  Get help right away if you start making plans for how to commit suicide. This information is not intended to replace advice given to you by your health care provider. Make sure you discuss any questions you have with your health care provider. Document Revised: 05/28/2020 Document Reviewed: 05/28/2020 Elsevier Patient Education  2021 [employer].

## 2021-02-23 NOTE — Plan of Care (Signed)
Newly admitted and adjusting well. Denies SI//HI/AVH. Getting along with peers and motivated for treatment. Pleasant and cooperative.

## 2021-02-23 NOTE — TOC Transition Note (Signed)
Transition of Care [facility]) - CM/SW Discharge Note   Patient Details  Name: Elizabeth Bell MRN: [id number] Date of Birth: [birth year]  [facility] CM/SW Contact:  Trish Mage, LCSW Phone Number: 02/23/2021, 9:58 AM   Clinical Narrative:   Patient who is stable for discharge will transfer to [facility] today.  Parents to transport.  Nursing, room assignment is 306-2. Call report to [phone].  TOC sign off.    Final next level of care: [facility] Barriers to Discharge: Barriers Resolved   Patient Goals and CMS Choice        Discharge Placement                       Discharge Plan and Services                                     Social Determinants of Health (SDOH) Interventions     Readmission Risk Interventions No flowsheet data found.

## 2021-02-23 NOTE — Progress Notes (Signed)
Report called to [facility].  Spoke with RN Veronique who accepted report for pt.   Aware pt will be discharging via POV accompanied by parents.

## 2021-02-23 NOTE — Discharge Summary (Signed)
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 Found to be essentially unresponsive on arrival to ED and vomiting. Intubated in ED, concern for aspiration. She self-extubated shortly after.  Admitted under PCCM initially and transferred under [facility] on 02/20/2021.  Seen by psychiatry, needs inpatient psych admission.    She is medically stable for discharge to inpatient psychiatry today.   Following problems were addressed during her hospitalization:    Acute encephalopathy: Resolved.  She is fully alert and oriented.  CT head unremarkable.  Suicidal ideation: Seen by psychiatry, they recommend inpatient psychiatric admission. She is medically cleared.  Acute hypoxic respiratory failure: Needed to be intubated in the ED however she self extubated herself shortly after and has remained on room air and is comfortable.  Chest x-ray negative for infiltrates.  Alcohol intoxication: Her alcohol level was 153 upon arrival.  No signs of withdrawal.  Started on thiamine, folic acid  Discharge Diagnoses:  Principal Problem:   Major depressive disorder, recurrent episode, severe ([place]) Active Problems:   Acute encephalopathy    Discharge Instructions  Discharge Instructions    Diet general   Complete by: As directed    Discharge instructions   Complete by: As directed    1)Please continue the medications as instructed   Increase activity slowly   Complete by: As directed      Allergies as of 02/23/2021   No Known Allergies      Medication List    STOP taking these medications   baclofen 10 MG tablet Commonly known as: LIORESAL   carbamazepine 100 MG 12 hr tablet Commonly known as: TEGRETOL XR     TAKE these medications   acetaminophen 500 MG tablet Commonly known as: TYLENOL Take 1,000 mg by mouth every 6 (six) hours as needed for mild pain.   albuterol 108 (90 Base) MCG/ACT inhaler Commonly known as: VENTOLIN HFA Inhale 2 puffs into the lungs every 6 (six) hours as needed for shortness of breath or wheezing.   FLUoxetine 20 MG capsule Commonly known as: PROZAC Take 1 capsule (20 mg total) by mouth daily.   folic acid 1 MG tablet Commonly known as: FOLVITE Take 1 tablet (1 mg total) by mouth daily.   Symbicort 160-4.5 MCG/ACT inhaler Generic drug: budesonide-formoterol Inhale 1-2 puffs into the lungs 2 (two) times daily.   thiamine 100 MG tablet Take 1 tablet (100 mg total) by mouth daily.   traZODone 50 MG tablet Commonly known as: DESYREL Take 1 tablet (50 mg total) by mouth at bedtime as needed for sleep.       No Known Allergies  Consultations:  PCCM,psychiatry   Procedures/Studies: CT HEAD WO CONTRAST  Result Date: 02/19/2021 CLINICAL DATA:  History of pituitary adenoma.  Acute encephalopathy. EXAM: CT HEAD WITHOUT CONTRAST TECHNIQUE: Contiguous axial images were obtained from the base of the skull through the vertex without intravenous contrast. COMPARISON:  None. FINDINGS: Brain: No evidence of acute infarction, hemorrhage, hydrocephalus, extra-axial collection or mass lesion/mass effect. History of pituitary adenoma with no visible abnormality at the sella. Vascular: No hyperdense vessel or unexpected calcification. Skull: Normal. Negative for fracture or focal  lesion. Sinuses/Orbits: No acute finding. IMPRESSION: Negative head CT. Electronically Signed   By: Monte Fantasia M.D.   On: 02/19/2021 05:00   DG Chest Portable 1 View  Result Date: 02/19/2021 CLINICAL DATA:   Intubated EXAM: PORTABLE CHEST 1 VIEW COMPARISON:  None. FINDINGS: Endotracheal tube tip just above the carina. Esophageal tube tip overlies the stomach. No focal consolidation or effusion. Normal cardiomediastinal silhouette. No pneumothorax. IMPRESSION: 1. Endotracheal tube tip just above the carina 2. Esophageal tube tip overlies the stomach 3. Clear lung fields Electronically Signed   By: Donavan Foil M.D.   On: 02/19/2021 02:31       Subjective:  Patient seen and examined the bedside this morning.  Hemodynamically stable for discharge.   Discharge Exam: Vitals:   02/22/21 2018 02/23/21 0702  BP: 118/74 107/72  Pulse: 68 73  Resp: 16 15  Temp: 98.5 F (36.9 C) 98.4 F (36.9 C)  SpO2: 99% 100%   Vitals:   02/22/21 0449 02/22/21 1417 02/22/21 2018 02/23/21 0702  BP: 109/66 114/70 118/74 107/72  Pulse: 64 64 68 73  Resp: 14 14 16 15   Temp: 98.9 F (37.2 C) 98.4 F (36.9 C) 98.5 F (36.9 C) 98.4 F (36.9 C)  TempSrc:  Oral    SpO2: 97% 100% 99% 100%  Weight:      Height:        General: Pt is alert, awake, not in acute distress Cardiovascular: RRR, S1/S2 +, no rubs, no gallops Respiratory: CTA bilaterally, no wheezing, no rhonchi Abdominal: Soft, NT, ND, bowel sounds + Extremities: no edema, no cyanosis    The results of significant diagnostics from this hospitalization (including imaging, microbiology, ancillary and laboratory) are listed below for reference.     Microbiology: Recent Results (from the past 240 hour(s))  Resp Panel by RT-PCR (Flu A&B, Covid) Nasopharyngeal Swab     Status: None   Collection Time: 02/19/21  1:31 AM   Specimen: Nasopharyngeal Swab; Nasopharyngeal(NP) swabs in vial transport medium  Result Value Ref Range Status   SARS Coronavirus 2 by RT PCR NEGATIVE NEGATIVE Final    Comment: (NOTE) SARS-CoV-2 target nucleic acids are NOT DETECTED.  The SARS-CoV-2 RNA is generally detectable in upper respiratory specimens during the acute  phase of infection. The lowest concentration of SARS-CoV-2 viral copies this assay can detect is 138 copies/mL. A negative result does not preclude SARS-Cov-2 infection and should not be used as the sole basis for treatment or other patient management decisions. A negative result may occur with  improper specimen collection/handling, submission of specimen other than nasopharyngeal swab, presence of viral mutation(s) within the areas targeted by this assay, and inadequate number of viral copies(<138 copies/mL). A negative result must be combined with clinical observations, patient history, and epidemiological information. The expected result is Negative.  Fact Sheet for Patients:  EntrepreneurPulse.com.au  Fact Sheet for Healthcare Providers:  IncredibleEmployment.be  This test is no t yet approved or cleared by the [place] FDA and  has been authorized for detection and/or diagnosis of SARS-CoV-2 by FDA under an Emergency Use Authorization (EUA). This EUA will remain  in effect (meaning this test can be used) for the duration of the COVID-19 declaration under Section 564(b)(1) of the Act, 21 U.S.C.section 360bbb-3(b)(1), unless the authorization is terminated  or revoked sooner.       Influenza A by PCR NEGATIVE NEGATIVE Final   Influenza B by PCR NEGATIVE NEGATIVE Final    Comment: (NOTE) The Xpert Xpress  SARS-CoV-2/FLU/RSV plus assay is intended as an aid in the diagnosis of influenza from Nasopharyngeal swab specimens and should not be used as a sole basis for treatment. Nasal washings and aspirates are unacceptable for Xpert Xpress SARS-CoV-2/FLU/RSV testing.  Fact Sheet for Patients: EntrepreneurPulse.com.au  Fact Sheet for Healthcare Providers: IncredibleEmployment.be  This test is not yet approved or cleared by the [place] FDA and has been authorized for detection and/or diagnosis of  SARS-CoV-2 by FDA under an Emergency Use Authorization (EUA). This EUA will remain in effect (meaning this test can be used) for the duration of the COVID-19 declaration under Section 564(b)(1) of the Act, 21 U.S.C. section 360bbb-3(b)(1), unless the authorization is terminated or revoked.  Performed at [facility], [place] [address], [place], [place] [address]   Resp Panel by RT-PCR (Flu A&B, Covid) Nasopharyngeal Swab     Status: None   Collection Time: 02/22/21  7:03 PM   Specimen: Nasopharyngeal Swab; Nasopharyngeal(NP) swabs in vial transport medium  Result Value Ref Range Status   SARS Coronavirus 2 by RT PCR NEGATIVE NEGATIVE Final    Comment: (NOTE) SARS-CoV-2 target nucleic acids are NOT DETECTED.  The SARS-CoV-2 RNA is generally detectable in upper respiratory specimens during the acute phase of infection. The lowest concentration of SARS-CoV-2 viral copies this assay can detect is 138 copies/mL. A negative result does not preclude SARS-Cov-2 infection and should not be used as the sole basis for treatment or other patient management decisions. A negative result may occur with  improper specimen collection/handling, submission of specimen other than nasopharyngeal swab, presence of viral mutation(s) within the areas targeted by this assay, and inadequate number of viral copies(<138 copies/mL). A negative result must be combined with clinical observations, patient history, and epidemiological information. The expected result is Negative.  Fact Sheet for Patients:  EntrepreneurPulse.com.au  Fact Sheet for Healthcare Providers:  IncredibleEmployment.be  This test is no t yet approved or cleared by the [place] FDA and  has been authorized for detection and/or diagnosis of SARS-CoV-2 by FDA under an Emergency Use Authorization (EUA). This EUA will remain  in effect (meaning this test can be used) for the duration  of the COVID-19 declaration under Section 564(b)(1) of the Act, 21 U.S.C.section 360bbb-3(b)(1), unless the authorization is terminated  or revoked sooner.       Influenza A by PCR NEGATIVE NEGATIVE Final   Influenza B by PCR NEGATIVE NEGATIVE Final    Comment: (NOTE) The Xpert Xpress SARS-CoV-2/FLU/RSV plus assay is intended as an aid in the diagnosis of influenza from Nasopharyngeal swab specimens and should not be used as a sole basis for treatment. Nasal washings and aspirates are unacceptable for Xpert Xpress SARS-CoV-2/FLU/RSV testing.  Fact Sheet for Patients: EntrepreneurPulse.com.au  Fact Sheet for Healthcare Providers: IncredibleEmployment.be  This test is not yet approved or cleared by the [place] FDA and has been authorized for detection and/or diagnosis of SARS-CoV-2 by FDA under an Emergency Use Authorization (EUA). This EUA will remain in effect (meaning this test can be used) for the duration of the COVID-19 declaration under Section 564(b)(1) of the Act, 21 U.S.C. section 360bbb-3(b)(1), unless the authorization is terminated or revoked.  Performed at [facility], [place] [address] [place], [place] [address]      Labs: BNP (last 3 results) No results for input(s): BNP in the last 8760 hours. Basic Metabolic Panel: Recent Labs  Lab 02/19/21 0134 02/19/21 0136 02/19/21 0451 02/21/21 1779  NA  --  140 140 137  K  --  3.3* 3.5 3.8  CL  --  108 107 105  CO2  --  25 21* 25  GLUCOSE  --  76 91 97  BUN  --  8 6 9   CREATININE  --  0.78 0.57 0.60  CALCIUM  --  9.3 9.3 9.4  MG 2.2  --   --  2.0  PHOS 3.4  --   --   --    Liver Function Tests: Recent Labs  Lab 02/19/21 0136  AST 18  ALT 10  ALKPHOS 76  BILITOT 0.4  PROT 7.2  ALBUMIN 4.2   No results for input(s): LIPASE, AMYLASE in the last 168 hours. No results for input(s): AMMONIA in the last 168 hours. CBC: Recent Labs  Lab  02/19/21 0136 02/19/21 0451  WBC 6.2 5.9  HGB 13.2 13.4  HCT 39.0 40.0  MCV 93.8 93.9  PLT 280 261   Cardiac Enzymes: No results for input(s): CKTOTAL, CKMB, CKMBINDEX, TROPONINI in the last 168 hours. BNP: Invalid input(s): POCBNP CBG: No results for input(s): GLUCAP in the last 168 hours. D-Dimer No results for input(s): DDIMER in the last 72 hours. Hgb A1c No results for input(s): HGBA1C in the last 72 hours. Lipid Profile No results for input(s): CHOL, HDL, LDLCALC, TRIG, CHOLHDL, LDLDIRECT in the last 72 hours. Thyroid function studies No results for input(s): TSH, T4TOTAL, T3FREE, THYROIDAB in the last 72 hours.  Invalid input(s): FREET3 Anemia work up No results for input(s): VITAMINB12, FOLATE, FERRITIN, TIBC, IRON, RETICCTPCT in the last 72 hours. Urinalysis    Component Value Date/Time   COLORURINE YELLOW 10/01/2020 1736   APPEARANCEUR CLEAR 10/01/2020 1736   LABSPEC 1.018 10/01/2020 1736   PHURINE 6.0 10/01/2020 1736   GLUCOSEU NEGATIVE 10/01/2020 1736   HGBUR NEGATIVE 10/01/2020 1736   BILIRUBINUR NEGATIVE 10/01/2020 1736   KETONESUR NEGATIVE 10/01/2020 1736   PROTEINUR NEGATIVE 10/01/2020 1736   NITRITE NEGATIVE 10/01/2020 1736   LEUKOCYTESUR NEGATIVE 10/01/2020 1736   Sepsis Labs Invalid input(s): PROCALCITONIN,  WBC,  LACTICIDVEN Microbiology Recent Results (from the past 240 hour(s))  Resp Panel by RT-PCR (Flu A&B, Covid) Nasopharyngeal Swab     Status: None   Collection Time: 02/19/21  1:31 AM   Specimen: Nasopharyngeal Swab; Nasopharyngeal(NP) swabs in vial transport medium  Result Value Ref Range Status   SARS Coronavirus 2 by RT PCR NEGATIVE NEGATIVE Final    Comment: (NOTE) SARS-CoV-2 target nucleic acids are NOT DETECTED.  The SARS-CoV-2 RNA is generally detectable in upper respiratory specimens during the acute phase of infection. The lowest concentration of SARS-CoV-2 viral copies this assay can detect is 138 copies/mL. A negative  result does not preclude SARS-Cov-2 infection and should not be used as the sole basis for treatment or other patient management decisions. A negative result may occur with  improper specimen collection/handling, submission of specimen other than nasopharyngeal swab, presence of viral mutation(s) within the areas targeted by this assay, and inadequate number of viral copies(<138 copies/mL). A negative result must be combined with clinical observations, patient history, and epidemiological information. The expected result is Negative.  Fact Sheet for Patients:  EntrepreneurPulse.com.au  Fact Sheet for Healthcare Providers:  IncredibleEmployment.be  This test is no t yet approved or cleared by the [place] FDA and  has been authorized for detection and/or diagnosis of SARS-CoV-2 by FDA under an Emergency Use Authorization (EUA). This EUA will remain  in effect (meaning this test can be  used) for the duration of the COVID-19 declaration under Section 564(b)(1) of the Act, 21 U.S.C.section 360bbb-3(b)(1), unless the authorization is terminated  or revoked sooner.       Influenza A by PCR NEGATIVE NEGATIVE Final   Influenza B by PCR NEGATIVE NEGATIVE Final    Comment: (NOTE) The Xpert Xpress SARS-CoV-2/FLU/RSV plus assay is intended as an aid in the diagnosis of influenza from Nasopharyngeal swab specimens and should not be used as a sole basis for treatment. Nasal washings and aspirates are unacceptable for Xpert Xpress SARS-CoV-2/FLU/RSV testing.  Fact Sheet for Patients: EntrepreneurPulse.com.au  Fact Sheet for Healthcare Providers: IncredibleEmployment.be  This test is not yet approved or cleared by the [place] FDA and has been authorized for detection and/or diagnosis of SARS-CoV-2 by FDA under an Emergency Use Authorization (EUA). This EUA will remain in effect (meaning this test can be used)  for the duration of the COVID-19 declaration under Section 564(b)(1) of the Act, 21 U.S.C. section 360bbb-3(b)(1), unless the authorization is terminated or revoked.  Performed at [facility], [place] [address], [place], [place] [address]   Resp Panel by RT-PCR (Flu A&B, Covid) Nasopharyngeal Swab     Status: None   Collection Time: 02/22/21  7:03 PM   Specimen: Nasopharyngeal Swab; Nasopharyngeal(NP) swabs in vial transport medium  Result Value Ref Range Status   SARS Coronavirus 2 by RT PCR NEGATIVE NEGATIVE Final    Comment: (NOTE) SARS-CoV-2 target nucleic acids are NOT DETECTED.  The SARS-CoV-2 RNA is generally detectable in upper respiratory specimens during the acute phase of infection. The lowest concentration of SARS-CoV-2 viral copies this assay can detect is 138 copies/mL. A negative result does not preclude SARS-Cov-2 infection and should not be used as the sole basis for treatment or other patient management decisions. A negative result may occur with  improper specimen collection/handling, submission of specimen other than nasopharyngeal swab, presence of viral mutation(s) within the areas targeted by this assay, and inadequate number of viral copies(<138 copies/mL). A negative result must be combined with clinical observations, patient history, and epidemiological information. The expected result is Negative.  Fact Sheet for Patients:  EntrepreneurPulse.com.au  Fact Sheet for Healthcare Providers:  IncredibleEmployment.be  This test is no t yet approved or cleared by the [place] FDA and  has been authorized for detection and/or diagnosis of SARS-CoV-2 by FDA under an Emergency Use Authorization (EUA). This EUA will remain  in effect (meaning this test can be used) for the duration of the COVID-19 declaration under Section 564(b)(1) of the Act, 21 U.S.C.section 360bbb-3(b)(1), unless the authorization is  terminated  or revoked sooner.       Influenza A by PCR NEGATIVE NEGATIVE Final   Influenza B by PCR NEGATIVE NEGATIVE Final    Comment: (NOTE) The Xpert Xpress SARS-CoV-2/FLU/RSV plus assay is intended as an aid in the diagnosis of influenza from Nasopharyngeal swab specimens and should not be used as a sole basis for treatment. Nasal washings and aspirates are unacceptable for Xpert Xpress SARS-CoV-2/FLU/RSV testing.  Fact Sheet for Patients: EntrepreneurPulse.com.au  Fact Sheet for Healthcare Providers: IncredibleEmployment.be  This test is not yet approved or cleared by the [place] FDA and has been authorized for detection and/or diagnosis of SARS-CoV-2 by FDA under an Emergency Use Authorization (EUA). This EUA will remain in effect (meaning this test can be used) for the duration of the COVID-19 declaration under Section 564(b)(1) of the Act, 21 U.S.C. section 360bbb-3(b)(1), unless the authorization is  terminated or revoked.  Performed at [facility], [place] [address] [place], [place] [address]     Please note: You were cared for by a hospitalist during your hospital stay. Once you are discharged, your primary care physician will handle any further medical issues. Please note that NO REFILLS for any discharge medications will be authorized once you are discharged, as it is imperative that you return to your primary care physician (or establish a relationship with a primary care physician if you do not have one) for your post hospital discharge needs so that they can reassess your need for medications and monitor your lab values.    Time coordinating discharge: 40 minutes  SIGNED:   Shelly Coss, MD  [facility] 02/23/2021, 9:59 AM Pager [phone]  If 7PM-7AM, please contact night-coverage [email or web address] Password [email or web address]

## 2021-02-24 DIAGNOSIS — F332 Major depressive disorder, recurrent severe without psychotic features: Principal | ICD-10-CM

## 2021-02-24 LAB — URINALYSIS, COMPLETE (UACMP) WITH MICROSCOPIC
Bacteria, UA: NONE SEEN
Bilirubin Urine: NEGATIVE
Glucose, UA: NEGATIVE mg/dL
Hgb urine dipstick: NEGATIVE
Ketones, ur: 5 mg/dL — AB
Leukocytes,Ua: NEGATIVE
Nitrite: NEGATIVE
Protein, ur: NEGATIVE mg/dL
Specific Gravity, Urine: 1.021 (ref 1.005–1.030)
pH: 5 (ref 5.0–8.0)

## 2021-02-24 LAB — LIPID PANEL
Cholesterol: 160 mg/dL (ref 0–200)
HDL: 50 mg/dL (ref >40)
LDL Cholesterol: 101 mg/dL — ABNORMAL HIGH (ref 0–99)
Total CHOL/HDL Ratio: 3.2 RATIO
Triglycerides: 43 mg/dL (ref <150)
VLDL: 9 mg/dL (ref 0–40)

## 2021-02-24 NOTE — Plan of Care (Signed)
  Problem: Education: Goal: Knowledge of [facility] Education information/materials will improve Outcome: Progressing Goal: Emotional status will improve Outcome: Progressing Goal: Mental status will improve Outcome: Progressing Goal: Verbalization of understanding the information provided will improve Outcome: Progressing   Problem: Activity: Goal: Interest or engagement in activities will improve Outcome: Progressing Goal: Sleeping patterns will improve Outcome: Progressing   Problem: Coping: Goal: Ability to verbalize frustrations and anger appropriately will improve Outcome: Progressing Goal: Ability to demonstrate self-control will improve Outcome: Progressing   

## 2021-02-24 NOTE — Progress Notes (Signed)
Pt has been med compliant this shift, she denied any SI/HI/AVH, she attended and participated in evening wrap up group, no new concerns voiced. She is resting in bed at this time, being monitored as ordered.

## 2021-02-24 NOTE — Progress Notes (Signed)
Adult Psychoeducational Group Note  Date:  02/24/2021 Time:  11:53 PM  Group Topic/Focus:  Wrap-Up Group:   The focus of this group is to help patients review their daily goal of treatment and discuss progress on daily workbooks.  Participation Level:  Active  Participation Quality:  Appropriate, Attentive and Sharing  Affect:  Anxious and Appropriate  Cognitive:  Appropriate  Insight: Appropriate  Engagement in Group:  Engaged  Modes of Intervention:  Discussion and Support  Additional Comments:  Today pt goal was to eat most of her food. Pt denies SI/HI. Pt spoke with mom and sister today via phone.  Pt contracts for safety.   Terrial Rhodes 02/24/2021, 11:53 PM

## 2021-02-24 NOTE — Progress Notes (Signed)
   02/24/21 1100  Psych Admission Type (Psych Patients Only)  Admission Status Voluntary  Psychosocial Assessment  Patient Complaints Anxiety  Eye Contact Fair  Facial Expression Anxious  Affect Anxious  Speech Logical/coherent  Interaction Assertive  Motor Activity Restless  Appearance/Hygiene Unremarkable  Behavior Characteristics Cooperative  Mood Anxious  Thought Process  Coherency Concrete thinking  Content WDL  Delusions None reported or observed  Perception WDL  Hallucination None reported or observed  Judgment Limited  Confusion None  Danger to Self  Current suicidal ideation? Denies  Danger to Others  Danger to Others None reported or observed

## 2021-02-24 NOTE — Progress Notes (Deleted)
[facility] Group Notes:  (Nursing/MHT/Case Management/Adjunct)  Date:  02/24/2021  Time:  12:02 AM  Type of Therapy:  Did not attend group  Participation Level:  Did Not Attend  Participation Quality:  DNA  Affect:  DNA  Cognitive:  DNA  Insight:  None  Engagement in Group:  DNA  Modes of Intervention:  DNA  Summary of Progress/Problems: Did not attend group  [place], Nehemiah Montee Casina 02/24/2021, 12:02 AM

## 2021-02-24 NOTE — Progress Notes (Signed)
[facility] Group Notes:  (Nursing/MHT/Case Management/Adjunct)  Date:  02/24/2021  Time:  12:09 AM  Type of Therapy:  wrap up group  Participation Level:  Active  Participation Quality:  Appropriate and Sharing  Affect:  Appropriate  Cognitive:  Alert and Appropriate  Insight:  Appropriate and Good  Engagement in Group:  Engaged and Improving  Modes of Intervention:  Problem-solving  Summary of Progress/Problems: Elizabeth Bell shared with the group how today was her first day and it felt weird.  She continued to say how she is looking to develop positive alternative coping skills and eat.  Elizabeth Bell 02/24/2021, 12:09 AM

## 2021-02-24 NOTE — Progress Notes (Cosign Needed)
Adult Psychoeducational Group Note  Date:  02/24/2021 Time:  11:35 AM  Group Topic/Focus:  Goals Group:   The focus of this group is to help patients establish daily goals to achieve during treatment and discuss how the patient can incorporate goal setting into their daily lives to aide in recovery.  Participation Level:  Active  Participation Quality:  Appropriate  Affect:  Appropriate  Cognitive:  Alert  Insight: Appropriate  Engagement in Group:  Engaged  Modes of Intervention:  Discussion  Additional Comments:  Pt attended group and participated in discussion.  Treyvonne Tata R Deloris Mittag 02/24/2021, 11:35 AM
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
## 2021-02-24 NOTE — [facility] Suicide Risk Assessment (Signed)
[facility] Admission Suicide Risk Assessment   Nursing information obtained from:  Patient Demographic factors:  Living alone Current Mental Status:  NA Loss Factors:  Loss of significant relationship (boyfriend in jail) Historical Factors:  NA Risk Reduction Factors:  Positive social support,Positive therapeutic relationship,Employed  Total Time spent with patient: 30 minutes Principal Problem: <principal problem not specified> Diagnosis:  Active Problems:   MDD (major depressive disorder), recurrent severe, without psychosis ([place])  Subjective Data: Patient is seen and examined.  Patient is a 20 year old female with a reported negative past psychiatric history who originally presented to the [facility] emergency department on 02/19/2021 after EMS had brought the patient from home.  Reportedly this was for suicidal ideation.  She presented with superficial lacerations to her bilateral wrists.  Noted to be nonresponsive and vomiting.  She was unable to protect her airway.  She was prepared for intubation.  She was intubated at 1:51 AM on 5/28.  She self extubated shortly before the critical care physician was to evaluate her at 2:52 AM.  She stated at that time she had no interest in living over the last several months.  She did not elaborate at that time.  She admitted to drinking alcohol earlier in the evening, and that she felt as though she had been intoxicated.  She does not drink on a regular basis.  She has a history of a nonfunctional pituitary mass.  She has been seen at the [facility] as well as [place] system for this.  She was admitted to the medical hospital for evaluation and stabilization.  Psychiatric consultation was requested, and the patient was seen on 02/20/2021.  The patient admitted to having a history of generalized anxiety as well as depression.  She reported multiple stressors in her life including employment, financial  stressors, boyfriend in jail, mother recently had a stroke, as well as her own benign pituitary adenoma.  She was started on fluoxetine 10 mg p.o. daily, trazodone 50 mg p.o. nightly as needed as well as the possibility of lorazepam as needed for withdrawal symptoms.  On 02/21/2021 she was seen in follow-up on consultation and her fluoxetine was increased to 20 mg a day.  It was recommended for transfer to our facility.  She was transferred on 66/1/22.  This a.m. on examination she is a pleasant young female.  She discussed the multiple stressors that she had in her life.  She denied current suicidal ideation.  She denied any previous psychiatric hospitalizations, she denied any previous suicide attempts.  She denied any history of family suicide attempts.  She was admitted to the hospital for evaluation and stabilization.  Continued Clinical Symptoms:  Alcohol Use Disorder Identification Test Final Score (AUDIT): 17 The "Alcohol Use Disorders Identification Test", Guidelines for Use in Primary Care, Second Edition.  World Pharmacologist [facility]). Score between 0-7:  no or low risk or alcohol related problems. Score between 8-15:  moderate risk of alcohol related problems. Score between 16-19:  high risk of alcohol related problems. Score 20 or above:  warrants further diagnostic evaluation for alcohol dependence and treatment.   CLINICAL FACTORS:   Severe Anxiety and/or Agitation Depression:   Impulsivity Alcohol/Substance Abuse/Dependencies   Musculoskeletal: Strength & Muscle Tone: within normal limits Gait & Station: normal Patient leans: N/A  Psychiatric Specialty Exam:  Presentation  General Appearance: Appropriate for Environment  Eye Contact:Fair  Speech:Normal Rate  Speech Volume:Normal  Handedness:Right   Mood and Affect  Mood:Anxious  Affect:Appropriate   Thought Process  Thought Processes:Coherent; Goal Directed  Descriptions of  Associations:Intact  Orientation:Full (Time, Place and Person)  Thought Content:Logical  History of Schizophrenia/Schizoaffective disorder:No data recorded Duration of Psychotic Symptoms:No data recorded Hallucinations:Hallucinations: None  Ideas of Reference:None  Suicidal Thoughts:Suicidal Thoughts: No  Homicidal Thoughts:Homicidal Thoughts: No   Sensorium  Memory:Immediate Good; Recent Good; Remote Good  Judgment:Intact  Insight:Fair   Executive Functions  Concentration:Good  Attention Span:Good  [place] of Knowledge:Good  Language:Good   Psychomotor Activity  Psychomotor Activity:Psychomotor Activity: Normal   Assets  Assets:Communication Skills; Desire for Improvement; Housing; Resilience; Talents/Skills; Transportation   Sleep  Sleep:Sleep: Good Number of Hours of Sleep: 6.25    Physical Exam: Physical Exam Vitals and nursing note reviewed.  Constitutional:      Appearance: Normal appearance.  HENT:     Head: Normocephalic and atraumatic.  Pulmonary:     Effort: Pulmonary effort is normal.  Neurological:     General: No focal deficit present.     Mental Status: She is alert and oriented to person, place, and time.    Review of Systems  Neurological: Positive for headaches.  All other systems reviewed and are negative.  Blood pressure 122/76, pulse 78, temperature 99.4 F (37.4 C), temperature source Oral, resp. rate 16, height 5\' 2"  (1.575 m), weight 52.6 kg, SpO2 100 %. Body mass index is 21.22 kg/m.   COGNITIVE FEATURES THAT CONTRIBUTE TO RISK:  None    SUICIDE RISK:   Mild:  Suicidal ideation of limited frequency, intensity, duration, and specificity.  There are no identifiable plans, no associated intent, mild dysphoria and related symptoms, good self-control (both objective and subjective assessment), few other risk factors, and identifiable protective factors, including available and accessible social support.  PLAN  OF CARE: Patient is seen and examined.  Patient is a 20 year old female with the above-stated past psychiatric history was transferred to our facility after becoming intoxicated and cutting her wrist.  She will be admitted to the hospital.  She will be integrated in the milieu.  She will be encouraged to attend groups.  We will continue her fluoxetine at 20 mg p.o. daily as well as the trazodone as needed.  We will also continue baclofen as written by the neurology service for headaches, and I have explained that we do not have Maxalt, but we do have super triptan, and I will write for that.  She stated that she does not drink alcohol frequently, but did become intoxicated on the night of the self injury.  She noted the stressors between employment, financial, boyfriend in jail as well as family issues in [place].  She admitted that she had previously been in therapy which was very beneficial for her.  She denied any previous suicide attempts or withdrawal syndromes.  Review of her laboratories revealed a blood gas on 5/28 that was essentially normal.  Her electrolytes from 5/30 were essentially normal including creatinine at 0.60 and liver function enzymes.  Lipid panel was normal.  CBC was normal.  Acetaminophen was less than 10, salicylate less than 7.  Her prolactin was 95.9.  This is most likely secondary to the adenoma.  TSH was normal at 1.142.  Beta-hCG was less than 5.  Blood alcohol on admission was 153.  Drug screen was positive for benzodiazepines.  CT scan of the head was essentially normal and showed no findings.  There was a history of pituitary adenoma with no visible abnormality in the  sella.  She has a low-grade temperature at 99.4.  We will order urinalysis.  The rest of her vital signs are stable.  Her pulse oximetry on room air was 100%.  We will also contact family members for collateral information for safety reasons.  I certify that inpatient services furnished can reasonably be expected to  improve the patient's condition.   Sharma Covert, MD 02/24/2021, 10:56 AM

## 2021-02-24 NOTE — Progress Notes (Signed)
Pt did attend nutrition group.

## 2021-02-24 NOTE — [facility] Counselor (Signed)
Adult Comprehensive Assessment  Patient ID: Elizabeth Bell, female   DOB: 11/07/2000, 20 y.o.   MRN: [id number]  Information Source: Information source: Patient  Current Stressors:  Patient states their primary concerns and needs for treatment are:: "I would like to have a therapist again, it was helpful to have someone to talk to, be able to feel okay again" Patient states their goals for this hospitilization and ongoing recovery are:: "I feel like I've been meeting in, like socializing, talking, ate way more than I thought I was going to" Educational / Learning stressors: No stress Employment / Job issues: "Yes, but I've found a job so now it's not stressful" Family Relationships: "Not every family is perfectPublishing copy / Lack of resources (include bankruptcy): "Ever since I moved into my apartment everything was good for the first month and the everything started going to [employer] / Lack of housing: No stress Physical health (include injuries & life threatening diseases): "Regular migraines, sinsitivity, asthma" Social relationships: No stress Substance abuse: "The reason why I'm in here was because of the alcohol, I drank 3 bottles and was stressed out about a lot of stuff; I don't want to drink anymore, I learned my lesson on that" Bereavement / Loss: "Boyfriend currently incarcerated"  Living/Environment/Situation:  Living Arrangements: Spouse/significant other Living conditions (as described by patient or guardian): "I'm a perfectionist, comfortable, neighbors are nice" Who else lives in the home?: Boyfriend How long has patient lived in current situation?: 4 months What is atmosphere in current home: Comfortable,Loving,Supportive  Family History:  Marital status: Long term relationship Long term relationship, how long?: 3 years Are you sexually active?: Yes What is your sexual orientation?: "Guys" Does patient have children?: No  Childhood History:  By whom was/is the  patient raised?: Both parents,Grandparents Additional childhood history information: "We moved a lot, moved a lot of schools, kept getting kicked out of them but I straightened out in high school" Description of patient's relationship with caregiver when they were a child: "They were loving parents, made sure I was in every extracurricular activity, showed me how to be very independent" Patient's description of current relationship with people who raised him/her: "Much closer, only because now I've moved out, I go and see them every other day now. Me and mom are much closer, still try to connect with my dad but he works a lot" How were you disciplined when you got in trouble as a child/adolescent?: "Whoopings, I didn't start having whoopings until I was 45 or 9, we would have to hold text books" Does patient have siblings?: Yes Number of Siblings: 1 Description of patient's current relationship with siblings: "She's older, we're close but not as close as we used to be" Did patient suffer any verbal/emotional/physical/sexual abuse as a child?: No Has patient ever been sexually abused/assaulted/raped as an adolescent or adult?: No (Pt hesitent to answer, extended silence, eventually responding "Uhm, no") Was the patient ever a victim of a crime or a disaster?: No Witnessed domestic violence?: No Has patient been affected by domestic violence as an adult?: No  Education:  Highest grade of school patient has completed: Some college Currently a student?: No Learning disability?: Yes What learning problems does patient have?: Hx of ADHD, ADD  Employment/Work Situation:   Employment situation: Employed Where is patient currently employed?: [employer] How long has patient been employed?: Beginning orientation What is the longest time patient has a held a job?: 1.5 years Where was the patient employed at  that time?: [employer] Has patient ever been in the [employer]?: No  Financial  Resources:   Financial resources: Income from [place] from parents / caregiver Does patient have a representative payee or guardian?: No  Alcohol/Substance Abuse:   What has been your use of drugs/alcohol within the last 12 months?: "Alcohol use approximately 3 days a week, 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; occaisional marijuana use, once a month at the most, last use beginning of May" Alcohol/Substance Abuse Treatment Hx: Denies past history Has alcohol/substance abuse ever caused legal problems?: No  Social Support System:   Pensions consultant Support System: Psychologist, prison and probation services Support System: "Mom, best friend, god sister, dad, cousins, and my dog" Type of faith/religion: Christianity How does patient's faith help to cope with current illness?: "Keeps me calm, gives me something to think about in the future"  Leisure/Recreation:   Do You Have Hobbies?: Yes Leisure and Hobbies: "I like to work, draw tattoos, do hair"  Strengths/Needs:   What is the patient's perception of their strengths?: "Listening, work at a fast pace" Patient states they can use these personal strengths during their treatment to contribute to their recovery: "When I'm finished talking to my therapist, listening to things that will help me out, coping skills" Patient states these barriers may affect their return to the community: None  Discharge Plan:   Currently receiving community mental health services: No Patient states concerns and preferences for aftercare planning are: Open to referrals for continued medication management and outpatient therapy Patient states they will know when they are safe and ready for discharge when: "The energy and how I've been successfully going to group, talking to people, I feel good" Does patient have access to transportation?: Yes Does patient have financial barriers related to discharge medications?: No Will patient be returning  to same living situation after discharge?: Yes  Summary/Recommendations:   Summary and Recommendations (to be completed by the evaluator): Rubina is a 20 y.o. female admitted voluntarily to [facility] after presenting to [facility] via EMS due to suicide attempt by cutting her forearm. Pt reported triggers being overwhelmed by mother's recent stroke and her own medical issues (benign pituitary adenoma), deciding to take her life by cutting and drinking increased amount alcohol. Pt reports stressors to include being unemployed, family relationship stress, financial issues, boyfriends recent incarceration, mother's stroke, and personal medical issues. Pt endorses SI, denies HI, AVH. Pt reports history of substance within the last 12 months to consist of alcohol use of one drink approximately 3 times weekly, and most recent use being Friday at which time pt drank 3 drinks/bottles of bootleggers. Pt does not currently receive any other community supports and has requested referrals to community providers in [place] for continued outpatient therapy and medication management. Patient will benefit from crisis stabilization, medication evaluation, group therapy and psychoeducation, in addition to case management for discharge planning. At discharge it is recommended that Patient adhere to the established discharge plan and continue in treatment.  Blane Ohara. 02/24/2021

## 2021-02-25 LAB — HEMOGLOBIN A1C
Hgb A1c MFr Bld: 5.2 % (ref 4.8–5.6)
Mean Plasma Glucose: 103 mg/dL

## 2021-02-25 MED ORDER — HYDROXYZINE HCL 25 MG PO TABS: 25.0000 mg | ORAL_TABLET | Freq: Three times a day (TID) | ORAL | 0 refills | Status: DC | PRN

## 2021-02-25 MED ORDER — FLUOXETINE HCL 20 MG PO CAPS: 20.0000 mg | ORAL_CAPSULE | Freq: Every day | ORAL | 0 refills | Status: DC

## 2021-02-25 MED ORDER — FLUTICASONE PROPIONATE HFA 44 MCG/ACT IN AERO: 2.0000 | INHALATION_SPRAY | Freq: Two times a day (BID) | RESPIRATORY_TRACT | 12 refills | Status: DC

## 2021-02-25 MED ORDER — TRAZODONE HCL 50 MG PO TABS: 50.0000 mg | ORAL_TABLET | Freq: Every evening | ORAL | 0 refills | Status: DC | PRN

## 2021-02-25 NOTE — Progress Notes (Signed)
Recreation Therapy Notes  Date:  6.3.22 Time: 0930 Location: [address] Dayroom  Group Topic: Stress Management  Goal Area(s) Addresses:  Patient will identify positive stress management techniques. Patient will identify benefits of using stress management post d/c.  Intervention: Stress Management   Activity: Meditation.  LRT played a meditation that focused on looking at each day as a new opportunity to start your day on a good note.    Education:  Stress Management, Discharge Planning.   Education Outcome: Acknowledges Education  Clinical Observations/Feedback: Pt did not attend group session.    Victorino Sparrow, LRT/CTRS         Victorino Sparrow A 02/25/2021 12:29 PM

## 2021-02-25 NOTE — Discharge Summary (Signed)
Physician Discharge Summary Note  Patient:  Elizabeth Bell is an 20 y.o., female MRN:  [id number] DOB:  28-Oct-2000 Patient phone:  [phone] (home)  Patient address:   [place] [address],   Total Time spent with patient: Greater than 30 minutes  Date of Admission:  02/23/2021 Date of Discharge: 02-25-21  Reason for Admission: Suicidal ideation with superficial lacerations to her bilateral wrists.   Principal Problem: MDD (major depressive disorder), recurrent severe, without psychosis ([place])  Discharge Diagnoses: Principal Problem:   MDD (major depressive disorder), recurrent severe, without psychosis ([place])  Past Psychiatric History: Major depressive disorder  Past Medical History:  Past Medical History:  Diagnosis Date  . Asthma   . Headache    History reviewed. No pertinent surgical history.  Family History: History reviewed. No pertinent family history.  Family Psychiatric  History: See H&P  Social History:  Social History   Substance and Sexual Activity  Alcohol Use None     Social History   Substance and Sexual Activity  Drug Use Not on file    Social History   Socioeconomic History  . Marital status: Single    Spouse name: Not on file  . Number of children: Not on file  . Years of education: Not on file  . Highest education level: Not on file  Occupational History  . Not on file  Tobacco Use  . Smoking status: Never Smoker  . Smokeless tobacco: Never Used  Vaping Use  . Vaping Use: Never used  Substance and Sexual Activity  . Alcohol use: Not on file  . Drug use: Not on file  . Sexual activity: Not on file  Other Topics Concern  . Not on file  Social History Narrative  . Not on file   Social Determinants of Health   Financial Resource Strain: Not on file  Food Insecurity: Not on file  Transportation Needs: Not on file  Physical Activity: Not on file  Stress: Not on file  Social Connections: Not on file    Hospital Course: (Per Md's admission evaluation notes): Patient is a 20 year old female with a reported negative past psychiatric history who originally presented to the [facility] emergency department on 02/19/2021 after EMS had brought the patient from home. Reportedly this was for suicidal ideation. She presented with superficial lacerations to her bilateral wrists. Noted to be nonresponsive and vomiting. She was unable to protect her airway. She was prepared for intubation. She was intubated at 1:51 AM on 5/28. She self extubated shortly before the critical care physician was to evaluate her at 2:52 AM. She stated at that time she had no interest in living over the last several months. She did not elaborate at that time. She admitted to drinking alcohol earlier in the evening, and that she felt as though she had been intoxicated. She does not drink on a regular basis. She has a history of a nonfunctional pituitary mass. She has been seen at the [facility] as well as [facility] for this. She was admitted to the medical hospital for evaluation and stabilization. Psychiatric consultation was requested, and the patient was seen on 02/20/2021. The patient admitted to having a history of generalized anxiety as well as depression. She reported multiple stressors in her life including employment, financial stressors, boyfriend in jail, mother recently had a stroke, as well as her own benign pituitary adenoma. She was started on fluoxetine 10 mg p.o. daily,  trazodone 50 mg p.o. nightly as needed as well as the possibility of lorazepam as needed for withdrawal symptoms. On 02/21/2021 she was seen in follow-up on consultation and her fluoxetine was increased to 20 mg a day. It was recommended for transfer to our facility. She was transferred on 66/1/22. This a.m. on examination she is a pleasant young female. She discussed the  multiple stressors that she had in her life. She denied current suicidal ideation. She denied any previous psychiatric hospitalizations, she denied any previous suicide attempts. She denied any history of family suicide attempts. She was admitted to the hospital for evaluation and stabilization.   Prior to this discharge, Elizabeth Bell was seen & evaluated for mental health stability. The current laboratory findings were reviewed (stable), nurses notes & vital signs were reviewed as well. There are no current mental health or medical issues that should prevent this discharge at this time. Patient is being discharged to continue mental health care as noted below.   After the above admission evaluation, Elizabeth Bell's presenting symptoms were noted. She was recommended for mood stabilization treatments. The medication regimen targeting those presenting symptoms were discussed with her & initiated with her consent. She was medicated, stabilized & discharged on the medications as listed on her discharge medication lists below. Besides the mood stabilization treatments, Jillianne was also enrolled & participated in the group counseling sessions being offered & held on this unit. She learned coping skills. She also presented other significant pre-existing medical issues that required treatment. She was resumed & discharged on all her pertinent home medications for those health issues. Elizabeth Bell's symptoms responded well to her treatment regimen warranting this discharge.  Elizabeth Bell currently presents mentally & medically stable to be discharged to continue mental health care & medication management on an outpatient as noted below. At this time of her hospital discharge, she is alert, attentive, well related, pleasant, mood improved & currently presents euthymic. Her affect is appropriate & positively reactive, no thought disorder noted, no suicidal or self injurious ideations reported, no homicidal or violent ideations present, no  hallucinations, no delusions, not internally preoccupied. She is future oriented. Her behavior on the unit was calm & in good control. She denies any medication side effects, which we reviewed with her. She will continue further mental health care & medication management on an outpatient basis as noted below. She is provided with all the necessary information needed to make this appointment without problems. She was able to engage in safety planning including plan to return to [facility] or contact emergency services if she feels unable to maintain her own safety or the safety of others. Pt had no further questions, comments or concerns.  She left bHH in no apparent distress with all personal belongings. Transportation per family (father).    Physical Findings: AIMS:  , ,  ,  ,    CIWA:    COWS:     Musculoskeletal: Strength & Muscle Tone: within normal limits Gait & Station: normal Patient leans: N/A  Psychiatric Specialty Exam:  Presentation  General Appearance: Appropriate for Environment  Eye Contact:Good  Speech:Normal Rate  Speech Volume:Normal  Handedness:Right  Mood and Affect  Mood:Euthymic  Affect:Appropriate  Thought Process  Thought Processes:Coherent  Descriptions of Associations:Intact  Orientation:Full (Time, Place and Person)  Thought Content:Logical  History of Schizophrenia/Schizoaffective disorder:No data recorded Duration of Psychotic Symptoms:No data recorded Hallucinations:Hallucinations: None  Ideas of Reference:None  Suicidal Thoughts:Suicidal Thoughts: No  Homicidal Thoughts:Homicidal Thoughts: No  Sensorium  Memory:Immediate Good; Recent  Good; Remote Good  Judgment:Good  Insight:Good  Executive Functions  Concentration:Good  Attention Span:Good  [place] of Knowledge:Good  Language:Good  Psychomotor Activity  Psychomotor Activity:Psychomotor Activity: Normal  Assets  Assets:Communication Skills; Desire for  Improvement; Housing; Resilience; Social Support; Talents/Skills  Sleep  Sleep:Sleep: Good Number of Hours of Sleep: 6.25  Physical Exam: Physical Exam Vitals and nursing note reviewed.  HENT:     Head: Normocephalic.     Nose: Nose normal.     Mouth/Throat:     Pharynx: Oropharynx is clear.  Eyes:     Pupils: Pupils are equal, round, and reactive to light.  Cardiovascular:     Rate and Rhythm: Normal rate.     Pulses: Normal pulses.  Pulmonary:     Effort: Pulmonary effort is normal.  Genitourinary:    Comments: Deferred Musculoskeletal:        General: Normal range of motion.     Cervical back: Normal range of motion.  Skin:    General: Skin is warm and dry.  Neurological:     General: No focal deficit present.     Mental Status: She is alert and oriented to person, place, and time. Mental status is at baseline.    Review of Systems  Constitutional: Negative.   HENT: Negative.   Eyes: Negative.   Respiratory: Negative.   Cardiovascular: Negative.   Gastrointestinal: Negative.   Genitourinary: Negative.   Musculoskeletal: Negative.   Skin: Negative.   Neurological: Negative.   Endo/Heme/Allergies:       Allergies: NKDA  Psychiatric/Behavioral: Positive for depression (Stable on medication) and substance abuse (Hx. alcohol/benzodiazepine use disorders). Negative for hallucinations, memory loss and suicidal ideas. The patient has insomnia (Stable on medication). The patient is not nervous/anxious (Stable upon discharge).    Blood pressure 115/78, pulse 69, temperature 98.8 F (37.1 C), temperature source Oral, resp. rate 16, height 5\' 2"  (1.575 m), weight 52.6 kg, SpO2 100 %. Body mass index is 21.22 kg/m.  Have you used any form of tobacco in the last 30 days? (Cigarettes, Smokeless Tobacco, Cigars, and/or Pipes): Yes  Has this patient used any form of tobacco in the last 30 days? (Cigarettes, Smokeless Tobacco, Cigars, and/or Pipes): N/A  Blood Alcohol level:   Lab Results  Component Value Date   ETH 153 (H) 76/28/3151   Metabolic Disorder Labs:  Lab Results  Component Value Date   HGBA1C 5.2 02/24/2021   MPG 103 02/24/2021   Lab Results  Component Value Date   PROLACTIN 95.9 (H) 02/19/2021   Lab Results  Component Value Date   CHOL 160 02/24/2021   TRIG 43 02/24/2021   HDL 50 02/24/2021   CHOLHDL 3.2 02/24/2021   VLDL 9 02/24/2021   LDLCALC 101 (H) 02/24/2021   See Psychiatric Specialty Exam and Suicide Risk Assessment completed by Attending Physician prior to discharge.  Discharge destination:  Home  Is patient on multiple antipsychotic therapies at discharge:  No   Has Patient had three or more failed trials of antipsychotic monotherapy by history:  No  Recommended Plan for Multiple Antipsychotic Therapies: NA  Allergies as of 02/25/2021   No Known Allergies     Medication List    STOP taking these medications   folic acid 1 MG tablet Commonly known as: FOLVITE     TAKE these medications     Indication  acetaminophen 500 MG tablet Commonly known as: TYLENOL Take 1,000 mg by mouth every 6 (six) hours as needed for  mild pain.  Indication: Fever, Pain   albuterol 108 (90 Base) MCG/ACT inhaler Commonly known as: VENTOLIN HFA Inhale 2 puffs into the lungs every 6 (six) hours as needed for shortness of breath or wheezing.  Indication: Asthma   FLUoxetine 20 MG capsule Commonly known as: PROZAC Take 1 capsule (20 mg total) by mouth daily. For depression What changed: additional instructions  Indication: Abuse or Misuse of Alcohol, Depression   fluticasone 44 MCG/ACT inhaler Commonly known as: FLOVENT HFA Inhale 2 puffs into the lungs 2 (two) times daily. For shortness of breath  Indication: Asthma   hydrOXYzine 25 MG tablet Commonly known as: ATARAX/VISTARIL Take 1 tablet (25 mg total) by mouth 3 (three) times daily as needed for anxiety.  Indication: Feeling Anxious   Symbicort 160-4.5 MCG/ACT  inhaler Generic drug: budesonide-formoterol Inhale 1-2 puffs into the lungs 2 (two) times daily.  Indication: Asthma   thiamine 100 MG tablet Take 1 tablet (100 mg total) by mouth daily.  Indication: Deficiency of Vitamin B1   traZODone 50 MG tablet Commonly known as: DESYREL Take 1 tablet (50 mg total) by mouth at bedtime as needed for sleep.  Indication: Trouble Sleeping       Follow-up Information    [facility].   Specialty: Behavioral Health Why: This facility will call you in the next 30 days for an appointment for medication management.  Contact information: [place] [address] [phone]        [facility].   Specialty: Behavioral Health Why: You have a virtual appointment for therapy 03/11/21 at 10:30am. This appointment will be virtual. A link will be sent to your email for this appointment.  Contact information: [address] [place] [address] [phone]              Follow-up recommendations: Activity:  As tolerated Diet: As recommended by your primary care doctor. Keep all scheduled follow-up appointments as recommended.   Comments: Prescriptions given at discharge.  Patient agreeable to plan.  Given opportunity to ask questions.  Appears to feel comfortable with discharge denies any current suicidal or homicidal thought. Patient is also instructed prior to discharge to: Take all medications as prescribed by his/her mental healthcare provider. Report any adverse effects and or reactions from the medicines to his/her outpatient provider promptly. Patient has been instructed & cautioned: To not engage in alcohol and or illegal drug use while on prescription medicines. In the event of worsening symptoms, patient is instructed to call the crisis hotline, 911 and or go to the nearest ED for appropriate evaluation and treatment of symptoms. To follow-up with  his/her primary care provider for your other medical issues, concerns and or health care needs.  Signed: Lindell Spar, NP, pmhnp, fnp-bc 02/25/2021, 10:10 AM

## 2021-02-25 NOTE — Progress Notes (Signed)
Adult Psychoeducational Group Note  Date:  02/25/2021 Time:  11:26 AM  Group Topic/Focus:  Goals Group:   The focus of this group is to help patients establish daily goals to achieve during treatment and discuss how the patient can incorporate goal setting into their daily lives to aide in recovery.  Participation Level:  Active  Participation Quality:  Appropriate  Affect:  Appropriate  Cognitive:  Appropriate  Insight: Appropriate  Engagement in Group:  Engaged  Modes of Intervention:  Discussion  Additional Comments:  Pt states her goal for the day is to get ready for discharge.  Pt stated one thing she learned while here is that everybody has a story.  Tonia Brooms D 02/25/2021, 11:26 AM

## 2021-02-25 NOTE — B[facility] Suicide Risk Assessment (Signed)
[place] INPATIENT:  Family/Significant Other Suicide Prevention Education  Suicide Prevention Education:  Education Completed; Rashunda Passon, Mother, [phone],  (name of family member/significant other) has been identified by the patient as the family member/significant other with whom the patient will be residing, and identified as the person(s) who will aid the patient in the event of a mental health crisis (suicidal ideations/suicide attempt).  With written consent from the patient, the family member/significant other has been provided the following suicide prevention education, prior to the and/or following the discharge of the patient.  The suicide prevention education provided includes the following:  Suicide risk factors  Suicide prevention and interventions  National Suicide Hotline telephone number  [facility] assessment telephone number  [facility] Emergency Assistance [place] and/or Residential Mobile Crisis Unit telephone number  Request made of family/significant other to:  Remove weapons (e.g., guns, rifles, knives), all items previously/currently identified as safety concern.    Remove drugs/medications (over-the-counter, prescriptions, illicit drugs), all items previously/currently identified as a safety concern.  The family member/significant other verbalizes understanding of the suicide prevention education information provided.  The family member/significant other agrees to remove the items of safety concern listed above.  No safety concerns noted. Discussed outpatient follow up. Ms. Soliman asked how to best support the pt. CSW shared that she felt this would be a good question to ask the pt as she can provide information on how to best support her. No weapons in pt's home. Weapons in mother's home secured.   Mliss Fritz 02/25/2021, 10:15 AM

## 2021-02-25 NOTE — BHH Suicide Risk Assessment (Signed)
[facility] Discharge Suicide Risk Assessment   Principal Problem: <principal problem not specified> Discharge Diagnoses: Active Problems:   MDD (major depressive disorder), recurrent severe, without psychosis ([place])   Total Time spent with patient: 20 minutes  Musculoskeletal: Strength & Muscle Tone: within normal limits Gait & Station: normal Patient leans: N/A  Psychiatric Specialty Exam  Presentation  General Appearance: Appropriate for Environment  Eye Contact:Good  Speech:Normal Rate  Speech Volume:Normal  Handedness:Right   Mood and Affect  Mood:Euthymic  Duration of Depression Symptoms: No data recorded Affect:Appropriate   Thought Process  Thought Processes:Coherent  Descriptions of Associations:Intact  Orientation:Full (Time, Place and Person)  Thought Content:Logical  History of Schizophrenia/Schizoaffective disorder:No data recorded Duration of Psychotic Symptoms:No data recorded Hallucinations:Hallucinations: None  Ideas of Reference:None  Suicidal Thoughts:Suicidal Thoughts: No  Homicidal Thoughts:Homicidal Thoughts: No   Sensorium  Memory:Immediate Good; Recent Good; Remote Good  Judgment:Good  Insight:Good   Executive Functions  Concentration:Good  Attention Span:Good  [place] of Knowledge:Good  Language:Good   Psychomotor Activity  Psychomotor Activity:Psychomotor Activity: Normal   Assets  Assets:Communication Skills; Desire for Improvement; Housing; Resilience; Social Support; Talents/Skills   Sleep  Sleep:Sleep: Good Number of Hours of Sleep: 6.25   Physical Exam: Physical Exam Vitals and nursing note reviewed.    Review of Systems  All other systems reviewed and are negative.  Blood pressure 115/78, pulse 69, temperature 98.8 F (37.1 C), temperature source Oral, resp. rate 16, height 5\' 2"  (1.575 m), weight 52.6 kg, SpO2 100 %. Body mass index is 21.22 kg/m.  Mental Status Per Nursing  Assessment::   On Admission:  NA  Demographic Factors:  Unemployed  Loss Factors: Financial problems/change in socioeconomic status  Historical Factors: Impulsivity  Risk Reduction Factors:   Positive social support and Positive coping skills or problem solving skills  Continued Clinical Symptoms:  Depression:   Impulsivity  Cognitive Features That Contribute To Risk:  None    Suicide Risk:  Minimal: No identifiable suicidal ideation.  Patients presenting with no risk factors but with morbid ruminations; may be classified as minimal risk based on the severity of the depressive symptoms   Follow-up Information    [place], Gwyndolyn Saxon A, LCAS Follow up.   Contact information: [address] [address] [phone]               Plan Of Care/Follow-up recommendations:  Activity:  ad lib  Sharma Covert, MD 02/25/2021, 9:10 AM

## 2021-02-25 NOTE — Progress Notes (Signed)
RN met with pt and reviewed pt's discharge instructions.  Pt verbalized understanding of discharge instructions and pt did not have any questions. RN reviewed and provided pt with a copy of SRA, AVS and Transition Record.  RN returned pt's belongings to pt.  Pt denied SI/HI/AVH and voiced no concerns.  Pt was appreciative of the care pt received at [facility].  Patient discharged to the lobby without incident.

## 2021-02-25 NOTE — Progress Notes (Signed)
  [facility] Adult Case Management Discharge Plan :  Will you be returning to the same living situation after discharge:  No.parents home then apartment At discharge, do you have transportation home?: Yes,  father Do you have the ability to pay for your medications: Yes,  private insurance  Release of information consent forms completed and in the chart;  Patient's signature needed at discharge.  Patient to Follow up at:  Follow-up Information    [facility].   Specialty: Behavioral Health Why: This facility will call you in the next 30 days for an appointment for medication management.  Contact information: [place] [address] [phone]        [facility], [place] [facility] Follow up.   Specialty: Behavioral Health Why: You have a virtual appointment for therapy 03/11/21 at 10:30am. This appointment will be virtual. A link will be sent to your email for this appointment.  Contact information: [place] [address] [phone]               Next level of care provider has access to [place] and Suicide Prevention discussed: Yes,  mother  Have you used any form of tobacco in the last 30 days? (Cigarettes, Smokeless Tobacco, Cigars, and/or Pipes): Yes  Has patient been referred to the Quitline?: N/A patient is not a smoker  Patient has been referred for addiction treatment: [place], [place] 02/25/2021, 10:08 AM

## 2021-02-25 NOTE — Progress Notes (Signed)
   02/25/21 0200  Psych Admission Type (Psych Patients Only)  Admission Status Voluntary  Psychosocial Assessment  Patient Complaints Anxiety  Eye Contact Fair  Facial Expression Anxious  Affect Anxious  Speech Logical/coherent  Interaction Assertive  Motor Activity Restless  Appearance/Hygiene Unremarkable  Behavior Characteristics Appropriate to situation  Mood Anxious  Thought Process  Coherency Concrete thinking  Content WDL  Delusions None reported or observed  Perception WDL  Hallucination None reported or observed  Judgment Limited  Confusion None  Danger to Self  Current suicidal ideation? Denies  Danger to Others  Danger to Others None reported or observed

## 2021-02-25 NOTE — Tx Team (Signed)
Interdisciplinary Treatment and Diagnostic Plan Update  02/25/2021 Time of Session: Elizabeth Bell MRN: [id number]  Principal Diagnosis: MDD (major depressive disorder), recurrent severe, without psychosis ([place])  Secondary Diagnoses: Principal Problem:   MDD (major depressive disorder), recurrent severe, without psychosis ([place])   Current Medications:  Current Facility-Administered Medications  Medication Dose Route Frequency Provider Last Rate Last Admin  . acetaminophen (TYLENOL) tablet 650 mg  650 mg Oral Q6H PRN White, Patrice L, NP   650 mg at 02/25/21 0830  . albuterol (VENTOLIN HFA) 108 (90 Base) MCG/ACT inhaler 1-2 puff  1-2 puff Inhalation Q6H PRN Sharma Covert, MD      . alum & mag hydroxide-simeth (MAALOX/MYLANTA) 200-200-20 MG/5ML suspension 30 mL  30 mL Oral Q4H PRN White, Patrice L, NP      . baclofen (LIORESAL) tablet 10 mg  10 mg Oral BID PRN Sharma Covert, MD      . FLUoxetine (PROZAC) capsule 20 mg  20 mg Oral Daily White, Patrice L, NP   20 mg at 02/25/21 0828  . fluticasone (FLOVENT HFA) 44 MCG/ACT inhaler 2 puff  2 puff Inhalation BID Sharma Covert, MD   2 puff at 02/25/21 [phone]  . folic acid (FOLVITE) tablet 1 mg  1 mg Oral Daily White, Patrice L, NP   1 mg at 02/25/21 6294  . hydrOXYzine (ATARAX/VISTARIL) tablet 25 mg  25 mg Oral TID PRN Darrol Angel L, NP   25 mg at 02/24/21 2118  . magnesium hydroxide (MILK OF MAGNESIA) suspension 30 mL  30 mL Oral Daily PRN White, Patrice L, NP      . multivitamin with minerals tablet 1 tablet  1 tablet Oral Daily White, Patrice L, NP   1 tablet at 02/25/21 0828  . thiamine tablet 100 mg  100 mg Oral Daily White, Patrice L, NP   100 mg at 02/25/21 7654  . traZODone (DESYREL) tablet 50 mg  50 mg Oral QHS PRN White, Patrice L, NP   50 mg at 02/23/21 2139   Current Outpatient Medications  Medication Sig Dispense Refill  . acetaminophen (TYLENOL) 500 MG tablet Take 1,000 mg by mouth every 6 (six) hours as needed for  mild pain.    Marland Kitchen albuterol (VENTOLIN HFA) 108 (90 Base) MCG/ACT inhaler Inhale 2 puffs into the lungs every 6 (six) hours as needed for shortness of breath or wheezing.    Marland Kitchen FLUoxetine (PROZAC) 20 MG capsule Take 1 capsule (20 mg total) by mouth daily. For depression 30 capsule 0  . fluticasone (FLOVENT HFA) 44 MCG/ACT inhaler Inhale 2 puffs into the lungs 2 (two) times daily. For shortness of breath 1 each 12  . hydrOXYzine (ATARAX/VISTARIL) 25 MG tablet Take 1 tablet (25 mg total) by mouth 3 (three) times daily as needed for anxiety. 78 tablet 0  . SYMBICORT 160-4.5 MCG/ACT inhaler Inhale 1-2 puffs into the lungs 2 (two) times daily.    Marland Kitchen thiamine 100 MG tablet Take 1 tablet (100 mg total) by mouth daily.    . traZODone (DESYREL) 50 MG tablet Take 1 tablet (50 mg total) by mouth at bedtime as needed for sleep. 30 tablet 0   PTA Medications: No medications prior to admission.    Patient Stressors: Loss of relationship Medication change or noncompliance Substance abuse  Patient Strengths: Average or above average intelligence Communication skills General fund of knowledge Motivation for treatment/growth Supportive family/friends  Treatment Modalities: Medication Management, Group therapy, Case management,  1 to 1 session  with clinician, Psychoeducation, Recreational therapy.   Physician Treatment Plan for Primary Diagnosis: MDD (major depressive disorder), recurrent severe, without psychosis ([place]) Long Term Goal(s): Improvement in symptoms so as ready for discharge Improvement in symptoms so as ready for discharge   Short Term Goals: Ability to identify changes in lifestyle to reduce recurrence of condition will improve Ability to verbalize feelings will improve Ability to disclose and discuss suicidal ideas Ability to demonstrate self-control will improve Ability to identify and develop effective coping behaviors will improve Ability to maintain clinical measurements within normal  limits will improve Ability to identify changes in lifestyle to reduce recurrence of condition will improve Ability to verbalize feelings will improve Ability to disclose and discuss suicidal ideas Ability to demonstrate self-control will improve Ability to identify and develop effective coping behaviors will improve Ability to maintain clinical measurements within normal limits will improve  Medication Management: Evaluate patient's response, side effects, and tolerance of medication regimen.  Therapeutic Interventions: 1 to 1 sessions, Unit Group sessions and Medication administration.  Evaluation of Outcomes: Adequate for Discharge  Physician Treatment Plan for Secondary Diagnosis: Principal Problem:   MDD (major depressive disorder), recurrent severe, without psychosis ([place])  Long Term Goal(s): Improvement in symptoms so as ready for discharge Improvement in symptoms so as ready for discharge   Short Term Goals: Ability to identify changes in lifestyle to reduce recurrence of condition will improve Ability to verbalize feelings will improve Ability to disclose and discuss suicidal ideas Ability to demonstrate self-control will improve Ability to identify and develop effective coping behaviors will improve Ability to maintain clinical measurements within normal limits will improve Ability to identify changes in lifestyle to reduce recurrence of condition will improve Ability to verbalize feelings will improve Ability to disclose and discuss suicidal ideas Ability to demonstrate self-control will improve Ability to identify and develop effective coping behaviors will improve Ability to maintain clinical measurements within normal limits will improve     Medication Management: Evaluate patient's response, side effects, and tolerance of medication regimen.  Therapeutic Interventions: 1 to 1 sessions, Unit Group sessions and Medication administration.  Evaluation of Outcomes:  Adequate for Discharge   RN Treatment Plan for Primary Diagnosis: MDD (major depressive disorder), recurrent severe, without psychosis ([place]) Long Term Goal(s): Knowledge of disease and therapeutic regimen to maintain health will improve  Short Term Goals: Ability to demonstrate self-control, Ability to participate in decision making will improve and Ability to verbalize feelings will improve  Medication Management: RN will administer medications as ordered by provider, will assess and evaluate patient's response and provide education to patient for prescribed medication. RN will report any adverse and/or side effects to prescribing provider.  Therapeutic Interventions: 1 on 1 counseling sessions, Psychoeducation, Medication administration, Evaluate responses to treatment, Monitor vital signs and CBGs as ordered, Perform/monitor CIWA, COWS, AIMS and Fall Risk screenings as ordered, Perform wound care treatments as ordered.  Evaluation of Outcomes: Adequate for Discharge   LCSW Treatment Plan for Primary Diagnosis: MDD (major depressive disorder), recurrent severe, without psychosis ([place]) Long Term Goal(s): Safe transition to appropriate next level of care at discharge, Engage patient in therapeutic group addressing interpersonal concerns.  Short Term Goals: Engage patient in aftercare planning with referrals and resources, Increase social support and Increase ability to appropriately verbalize feelings  Therapeutic Interventions: Assess for all discharge needs, 1 to 1 time with Social worker, Explore available resources and support systems, Assess for adequacy in community support network, Educate family and significant other(s) on  suicide prevention, Complete Psychosocial Assessment, Interpersonal group therapy.  Evaluation of Outcomes: Adequate for Discharge   Progress in Treatment: Attending groups: Yes. Participating in groups: Yes. Taking medication as prescribed: Yes. Toleration  medication: Yes. Family/Significant other contact made: Yes, individual(s) contacted:  mother Patient understands diagnosis: Yes. Discussing patient identified problems/goals with staff: Yes. Medical problems stabilized or resolved: Yes. Denies suicidal/homicidal ideation: Yes. Issues/concerns per patient self-inventory: Yes. Other: None  New problem(s) identified: No, Describe:  None  New Short Term/Long Term Goal(s):medication stabilization, elimination of SI thoughts, development of comprehensive mental wellness plan.  Patient Goals:  Did Not Attend  Discharge Plan or Barriers: Patient recently admitted. CSW will continue to follow and assess for appropriate referrals and possible discharge planning.  Reason for Continuation of Hospitalization: Depression Medication stabilization Suicidal ideation  Estimated Length of Stay: 3-5 days  Attendees: Patient: 02/25/2021   Physician:  02/25/2021   Nursing:  02/25/2021   RN Care Manager: 02/25/2021   Social Worker: Toney Reil, LCSWA 02/25/2021   Recreational Therapist:  02/25/2021   Other:  02/25/2021   Other:  02/25/2021   Other: 02/25/2021     Scribe for Treatment Team: Mliss Fritz, LCSWA 02/25/2021 2:25 PM

## 2021-02-26 LAB — PROLACTIN: Prolactin: 66.1 ng/mL — ABNORMAL HIGH (ref 4.8–23.3)

## 2021-03-30 ENCOUNTER — Encounter: Payer: Self-pay | Admitting: Adult Health

## 2021-03-30 ENCOUNTER — Ambulatory Visit (INDEPENDENT_AMBULATORY_CARE_PROVIDER_SITE_OTHER): Payer: BC Managed Care – PPO | Admitting: Adult Health

## 2021-03-30 ENCOUNTER — Other Ambulatory Visit: Payer: Self-pay

## 2021-03-30 VITALS — BP 108/67 | HR 75 | Ht 61.0 in | Wt 119.0 lb

## 2021-03-30 DIAGNOSIS — F332 Major depressive disorder, recurrent severe without psychotic features: Secondary | ICD-10-CM

## 2021-03-30 NOTE — Progress Notes (Signed)
[facility] MD/PA/NP Initial Note  03/30/2021 4:12 PM Elizabeth Bell  MRN:  [id number]  Chief Complaint:   HPI:   Describes mood today as "ok". Pleasant. Denies tearfulness. Mood symptoms - denies depression, anxiety, and irritability. Stating "I'm doing alright". Has stopped both Trazadone and Prozac. Stating "I don't feel like I need medications". Has been taking a supplement at bedtime to help her rest. Feels like mood has improved since hospitalization. Stating "I feel like a lot of the reason I went to the hospital was because of my boyfriend". He was incarcerated April 28th and is scheduled to get out in August. Stating "it was a month since he left and I was feeling down". Drank a lot of alcohol - usually drinks minimally. Stating "I could have died". Denies any recent self harm or cutting. Stable interest and motivation. Taking medications as prescribed.  Energy levels stable. Active, does not have a regular exercise routine. Walking 7,000 steps or more at work. Enjoys some usual interests and activities. Dating - has a boyfriend of 3 years. Lives alone. Family local - parents and sister. Spending time with family. Appetite adequate - 119 pounds. Weight stable. Sleeps well most nights. Averages 6 or more hours during the work week - more on the weekend. Focus and concentration .stable. Started a new job recently at [employer] - going well. Completing tasks. Managing aspects of household.  Denies SI or HI.  Denies AH or VH  Previous medication trials:  Remeron, Hydroxyzine, Tegretol,   Visit Diagnosis:    ICD-10-CM   1. MDD (major depressive disorder), recurrent severe, without psychosis ([place])  F33.2       Past Psychiatric History: Recent inpatient stay at [facility] - [facility] reviewed.  Past Medical History:  Past Medical History:  Diagnosis Date   Asthma    Headache    No past surgical history on file.  Family Psychiatric History: Family history of mental  illness - Schizophrenia   Family History: No family history on file.  Social History:  Social History   Socioeconomic History   Marital status: Single    Spouse name: Not on file   Number of children: Not on file   Years of education: Not on file   Highest education level: Not on file  Occupational History   Not on file  Tobacco Use   Smoking status: Never   Smokeless tobacco: Never  Vaping Use   Vaping Use: Never used  Substance and Sexual Activity   Alcohol use: Not on file   Drug use: Not on file   Sexual activity: Not on file  Other Topics Concern   Not on file  Social History Narrative   Not on file   Social Determinants of Health   Financial Resource Strain: Not on file  Food Insecurity: Not on file  Transportation Needs: Not on file  Physical Activity: Not on file  Stress: Not on file  Social Connections: Not on file    Allergies: No Known Allergies  Metabolic Disorder Labs: Lab Results  Component Value Date   HGBA1C 5.2 02/24/2021   MPG 103 02/24/2021   Lab Results  Component Value Date   PROLACTIN 66.1 (H) 02/25/2021   PROLACTIN 95.9 (H) 02/19/2021   Lab Results  Component Value Date   CHOL 160 02/24/2021   TRIG 43 02/24/2021   HDL 50 02/24/2021   CHOLHDL 3.2 02/24/2021   VLDL 9 02/24/2021   LDLCALC 101 (H) 02/24/2021   Lab Results  Component Value Date   TSH 1.142 02/19/2021    Therapeutic Level Labs: No results found for: LITHIUM No results found for: VALPROATE No components found for:  CBMZ  Current Medications: Current Outpatient Medications  Medication Sig Dispense Refill   acetaminophen (TYLENOL) 500 MG tablet Take 1,000 mg by mouth every 6 (six) hours as needed for mild pain.     albuterol (VENTOLIN HFA) 108 (90 Base) MCG/ACT inhaler Inhale 2 puffs into the lungs every 6 (six) hours as needed for shortness of breath or wheezing.     FLUoxetine (PROZAC) 20 MG capsule Take 1 capsule (20 mg total) by mouth daily. For depression  30 capsule 0   fluticasone (FLOVENT HFA) 44 MCG/ACT inhaler Inhale 2 puffs into the lungs 2 (two) times daily. For shortness of breath 1 each 12   hydrOXYzine (ATARAX/VISTARIL) 25 MG tablet Take 1 tablet (25 mg total) by mouth 3 (three) times daily as needed for anxiety. 78 tablet 0   SYMBICORT 160-4.5 MCG/ACT inhaler Inhale 1-2 puffs into the lungs 2 (two) times daily.     thiamine 100 MG tablet Take 1 tablet (100 mg total) by mouth daily.     traZODone (DESYREL) 50 MG tablet Take 1 tablet (50 mg total) by mouth at bedtime as needed for sleep. 30 tablet 0   No current facility-administered medications for this visit.    Medication Side Effects: none  Orders placed this visit:  No orders of the defined types were placed in this encounter.   Psychiatric Specialty Exam:  Review of Systems  Musculoskeletal:  Negative for gait problem.  Neurological:  Negative for tremors.  Psychiatric/Behavioral:         Please refer to HPI   Blood pressure 108/67, pulse 75, height 5\' 1"  (1.549 m), weight 119 lb (54 kg).Body mass index is 22.48 kg/m.  General Appearance: Casual and Neat  Eye Contact:  Good  Speech:  Clear and Coherent  Volume:  Normal  Mood:  Euthymic  Affect:  Appropriate and Congruent   Thought Process:  Coherent and Descriptions of Associations: Intact  Orientation:  Full (Time, Place, and Person)  Thought Content: Logical   Suicidal Thoughts:  No  Homicidal Thoughts:  No  Memory:  WNL  Judgement:  Good  Insight:  Good  Psychomotor Activity:  Normal  Concentration:  Concentration: Good  Recall:  Good  Fund of Knowledge: Good  Language: Good  Assets:  Communication Skills Desire for Improvement Financial Resources/Insurance Housing Intimacy Leisure Time Physical Health Resilience Social Support Talents/Skills Transportation Vocational/Educational  ADL's:  Intact  Cognition: WNL  Prognosis:  Good   Screenings:  AUDIT    Flowsheet Row Admission (Discharged)  from 02/23/2021 in [facility]  Alcohol Use Disorder Identification Test Final Score (AUDIT) 17      Flowsheet Row Admission (Discharged) from 02/23/2021 in [facility] ED to Hosp-Admission (Discharged) from 02/19/2021 in [facility] Error: Q3, 4, or 5 should not be populated when Q2 is No Error: Q3, 4, or 5 should not be populated when Q2 is No       Receiving Psychotherapy: No   Treatment Plan/Recommendations: Plan:  PDMP reviewed  Patient seen for 60 minutes and time spent discussing treatment options. Patient has stopped medications prescribed at discharge and does not wish to take medications at this time. She is willing to set up a therapy appointment.  Patient advised to contact office with any questions, adverse effects, or acute worsening in signs and symptoms.      Aloha Gell, NP

## 2021-04-28 ENCOUNTER — Other Ambulatory Visit (HOSPITAL_COMMUNITY): Payer: Self-pay | Admitting: Neurosurgery

## 2021-04-28 ENCOUNTER — Other Ambulatory Visit: Payer: Self-pay | Admitting: Neurosurgery

## 2021-04-28 DIAGNOSIS — E236 Other disorders of pituitary gland: Secondary | ICD-10-CM

## 2021-05-12 ENCOUNTER — Other Ambulatory Visit: Payer: Self-pay

## 2021-05-12 ENCOUNTER — Ambulatory Visit (INDEPENDENT_AMBULATORY_CARE_PROVIDER_SITE_OTHER): Payer: BC Managed Care – PPO | Admitting: Psychiatry

## 2021-05-12 ENCOUNTER — Encounter: Payer: Self-pay | Admitting: Psychiatry

## 2021-05-12 DIAGNOSIS — F411 Generalized anxiety disorder: Secondary | ICD-10-CM | POA: Diagnosis not present
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
## 2021-05-19 ENCOUNTER — Emergency Department (HOSPITAL_COMMUNITY)
Admission: EM | Admit: 2021-05-19 | Discharge: 2021-05-19 | Disposition: A | Discharge status: Home / Self Care | Payer: BC Managed Care – PPO | Attending: Emergency Medicine | Admitting: Emergency Medicine

## 2021-05-19 ENCOUNTER — Other Ambulatory Visit: Payer: Self-pay

## 2021-05-19 DIAGNOSIS — R102 Pelvic and perineal pain: Secondary | ICD-10-CM | POA: Insufficient documentation

## 2021-05-19 DIAGNOSIS — J45909 Unspecified asthma, uncomplicated: Secondary | ICD-10-CM | POA: Insufficient documentation

## 2021-05-19 DIAGNOSIS — X58XXXA Exposure to other specified factors, initial encounter: Secondary | ICD-10-CM | POA: Insufficient documentation

## 2021-05-19 DIAGNOSIS — T192XXA Foreign body in vulva and vagina, initial encounter: Secondary | ICD-10-CM | POA: Diagnosis present

## 2021-05-19 MED ORDER — ACETAMINOPHEN 325 MG PO TABS
650.0000 mg | ORAL_TABLET | Freq: Once | ORAL | Status: AC
  Administered 2021-05-19: 650 mg via ORAL
  Filled 2021-05-19: qty 2

## 2021-05-19 NOTE — ED Triage Notes (Signed)
Patient placed a flex-disc menstrual cup in her vagina this morning, is having trouble removing it today. Can feel the edges but is unable to remove. No other complaints.

## 2021-05-19 NOTE — Discharge Instructions (Addendum)
  You were evaluated in the Emergency Department and after careful evaluation, we did not find any emergent condition requiring admission or further testing in the hospital.   Please refrain from intercourse for the next 2 days, do not have intercourse when you use the menstrual cup.  You can take Tylenol or ibuprofen as directed on the bottle for pain, if you continue to have vaginal pain over the next couple of days please come back to the emergency department. Please return to the Emergency Department if you experience any worsening of your condition.  Thank you for allowing [place] to be a part of your care. Please speak to your pharmacist about any new medications prescribed today in regards to side effects or interactions with other medications.

## 2021-05-19 NOTE — ED Provider Notes (Signed)
[facility] DEPT Provider Note   CSN: [id number] Arrival date & time: 05/19/21  1458     History No chief complaint on file.   Elizabeth Bell is a 20 y.o. female with no pertinent past medical history that presents to the emerge department today for foreign body.  Patient states that she placed a flex disc menstrual cup into her vagina this morning after she started her menstrual cycle.  Patient states that she then started having intercourse an hour later, states that she then fell asleep and she started having some pelvic pain, realized that she was unable to reach her menstrual cup.  States that her boyfriend also tried retrieving the cup without success.  Patient admits to pain in her vagina, and nowhere else currently.  Denies any vaginal discharge, abdominal pain, nausea, vomiting.  Patient states that she was in her normal health prior to this.  Denies any other objects in her vagina.  HPI     Past Medical History:  Diagnosis Date   Asthma    Headache     Patient Active Problem List   Diagnosis Date Noted   MDD (major depressive disorder), recurrent severe, without psychosis ([place]) 02/23/2021   Major depressive disorder, recurrent episode, severe ([place]) 02/20/2021   Acute encephalopathy 02/19/2021    No past surgical history on file.   OB History   No obstetric history on file.     Family History  Problem Relation Age of Onset   Bipolar disorder Sister     Social History   Tobacco Use   Smoking status: Never   Smokeless tobacco: Never  Vaping Use   Vaping Use: Some days  Substance Use Topics   Alcohol use: Not Currently    Home Medications Prior to Admission medications   Medication Sig Start Date End Date Taking? Authorizing Provider  acetaminophen (TYLENOL) 500 MG tablet Take 1,000 mg by mouth every 6 (six) hours as needed for mild pain.    [provider]  albuterol (VENTOLIN HFA) 108 (90 Base) MCG/ACT inhaler  Inhale 2 puffs into the lungs every 6 (six) hours as needed for shortness of breath or wheezing. 10/01/20   [provider]  FLUoxetine (PROZAC) 20 MG capsule Take 1 capsule (20 mg total) by mouth daily. For depression 02/25/21   Lindell Spar I, NP  fluticasone (FLOVENT HFA) 44 MCG/ACT inhaler Inhale 2 puffs into the lungs 2 (two) times daily. For shortness of breath 02/25/21   Lindell Spar I, NP  hydrOXYzine (ATARAX/VISTARIL) 25 MG tablet Take 1 tablet (25 mg total) by mouth 3 (three) times daily as needed for anxiety. 02/25/21   Lindell Spar I, NP  SYMBICORT 160-4.5 MCG/ACT inhaler Inhale 1-2 puffs into the lungs 2 (two) times daily. 08/27/20   [provider]  thiamine 100 MG tablet Take 1 tablet (100 mg total) by mouth daily. 02/23/21   Shelly Coss, MD  traZODone (DESYREL) 50 MG tablet Take 1 tablet (50 mg total) by mouth at bedtime as needed for sleep. 02/25/21   Encarnacion Slates, NP    Allergies    Patient has no known allergies.  Review of Systems   Review of Systems  Constitutional:  Negative for diaphoresis, fatigue and fever.  Eyes:  Negative for visual disturbance.  Respiratory:  Negative for shortness of breath.   Cardiovascular:  Negative for chest pain.  Gastrointestinal:  Negative for nausea and vomiting.  Musculoskeletal:  Negative for back pain and myalgias.  Skin:  Negative for color change, pallor, rash and wound.  Neurological:  Negative for syncope, weakness, light-headedness, numbness and headaches.  Psychiatric/Behavioral:  Negative for behavioral problems and confusion.    Physical Exam Updated Vital Signs BP 133/86 (BP Location: Right Arm)   Pulse 90   Temp 98.3 F (36.8 C) (Oral)   Resp 18   LMP 05/19/2021   SpO2 98%   Physical Exam Constitutional:      General: She is not in acute distress.    Appearance: Normal appearance. She is not ill-appearing, toxic-appearing or diaphoretic.  HENT:     Mouth/Throat:     Mouth: Mucous membranes are moist.      Pharynx: Oropharynx is clear.  Eyes:     General: No scleral icterus.    Extraocular Movements: Extraocular movements intact.     Pupils: Pupils are equal, round, and reactive to light.  Cardiovascular:     Rate and Rhythm: Normal rate and regular rhythm.     Pulses: Normal pulses.     Heart sounds: Normal heart sounds.  Pulmonary:     Effort: Pulmonary effort is normal. No respiratory distress.     Breath sounds: Normal breath sounds. No stridor. No wheezing, rhonchi or rales.  Chest:     Chest wall: No tenderness.  Abdominal:     General: Abdomen is flat. There is no distension.     Palpations: Abdomen is soft.     Tenderness: There is no abdominal tenderness. There is no guarding or rebound.  Genitourinary:    Comments: Chaperone present.  Normal external vaginal exam, internal vaginal exam with menstrual cup in proximal vaginal canal, able to remove this with alligator forceps and may hand.  Please see procedure note.  After this was removed, no other foreign objects or pieces of vaginal cup left in vaginal vault.  Normal cervix, closed, patient is on menstrual cycle.  Cervix is nonfriable, bimanual exam with no tenderness. Musculoskeletal:        General: No swelling or tenderness. Normal range of motion.     Cervical back: Normal range of motion and neck supple. No rigidity.     Right lower leg: No edema.     Left lower leg: No edema.  Skin:    General: Skin is warm and dry.     Capillary Refill: Capillary refill takes less than 2 seconds.     Coloration: Skin is not pale.  Neurological:     General: No focal deficit present.     Mental Status: She is alert and oriented to person, place, and time.  Psychiatric:        Mood and Affect: Mood normal.        Behavior: Behavior normal.    ED Results / Procedures / Treatments   Labs (all labs ordered are listed, but only abnormal results are displayed) Labs Reviewed - No data to display  EKG None  Radiology No  results found.  Procedures .Foreign Body Removal  Date/Time: 05/19/2021 3:32 PM Performed by: Alfredia Client, PA-C Authorized by: Alfredia Client, PA-C  Consent: Verbal consent obtained. Written consent obtained. Risks and benefits: risks, benefits and alternatives were discussed Consent given by: patient Patient understanding: patient states understanding of the procedure being performed Patient identity confirmed: verbally with patient Body area: vagina  Sedation: Patient sedated: no  Patient restrained: no Patient cooperative: no Localization method: visualized and speculum Removal mechanism: alligator forceps Complexity: simple 1 objects recovered. Objects recovered: menstrual cup Post-procedure assessment: foreign  body removed Patient tolerance: patient tolerated the procedure well with no immediate complications    Medications Ordered in ED Medications  acetaminophen (TYLENOL) tablet 650 mg (has no administration in time range)    ED Course  I have reviewed the triage vital signs and the nursing notes.  Pertinent labs & imaging results that were available during my care of the patient were reviewed by me and considered in my medical decision making (see chart for details).    MDM Rules/Calculators/A&P                          20 year old female presents emerged from today for foreign object in vagina, patient placed menstrual cup earlier this morning.  After having intercourse menstrual cup was dislodged to proximal vaginal vault.  Able to retrieve this, please see procedure note.  Post procedure, patient appears comfortable with normal vaginal exam.  Patient to be discharged at this time, symptomatic treatment discussed.  No need for antibiotics, object was in vagina for less than 6 hours.  Patient appears very well.  Doubt need for further emergent work up at this time. I explained the diagnosis and have given explicit precautions to return to the ER including for any  other new or worsening symptoms. The patient understands and accepts the medical plan as it's been dictated and I have answered their questions. Discharge instructions concerning home care and prescriptions have been given. The patient is STABLE and is discharged to home in good condition.  Final Clinical Impression(s) / ED Diagnoses Final diagnoses:  Vaginal foreign object, initial encounter    Rx / DC Orders ED Discharge Orders     None        Alfredia Client, PA-C 05/19/21 1600    Valarie Merino, MD 05/24/21 1012

## 2021-05-25 ENCOUNTER — Other Ambulatory Visit: Payer: Self-pay

## 2021-05-25 ENCOUNTER — Ambulatory Visit
Admission: RE | Admit: 2021-05-25 | Discharge: 2021-05-25 | Disposition: A | Discharge status: Home / Self Care | Payer: BC Managed Care – PPO | Source: Ambulatory Visit | Attending: Neurosurgery | Admitting: Neurosurgery

## 2021-05-25 DIAGNOSIS — E236 Other disorders of pituitary gland: Secondary | ICD-10-CM | POA: Diagnosis present

## 2021-05-25 MED ORDER — GADOBUTROL 1 MMOL/ML IV SOLN
4.0000 mL | Freq: Once | INTRAVENOUS | Status: AC | PRN
  Administered 2021-05-25: 4 mL via INTRAVENOUS

## 2021-06-29 ENCOUNTER — Ambulatory Visit (INDEPENDENT_AMBULATORY_CARE_PROVIDER_SITE_OTHER): Payer: BC Managed Care – PPO | Admitting: Certified Nurse Midwife

## 2021-06-29 ENCOUNTER — Other Ambulatory Visit: Payer: Self-pay

## 2021-06-29 ENCOUNTER — Encounter: Payer: Self-pay | Admitting: Certified Nurse Midwife

## 2021-06-29 VITALS — BP 122/80 | HR 78 | Ht 60.0 in | Wt 112.8 lb

## 2021-06-29 DIAGNOSIS — Z3200 Encounter for pregnancy test, result unknown: Secondary | ICD-10-CM | POA: Diagnosis not present

## 2021-06-29 LAB — POCT URINE PREGNANCY: Preg Test, Ur: NEGATIVE

## 2021-06-29 NOTE — Addendum Note (Signed)
Addended by: Ova Freshwater on: 06/29/2021 02:12 PM   Modules accepted: Orders

## 2021-06-29 NOTE — Progress Notes (Signed)
Subjective:    Elizabeth Bell is a 20 y.o. female who presents for evaluation of menstrual irregularity. Thought she was pregnant but started her period 06/22/21 then had a positive pregnancy test a few days later.  She believes she could be pregnant. She state she has a family history of females being pregnant that continue to have mestural cycles while pregnant. Pregnancy is desired. Sexual Activity: single partner, contraception: none. Current symptoms also include: fatigue. Last period was 06/22/21.   No LMP recorded. The following portions of the patient's history were reviewed and updated as appropriate: allergies, current medications, past family history, past medical history, past social history, past surgical history, and problem list.  Review of Systems Pertinent items are noted in HPI.     Objective:    There were no vitals taken for this visit. General: alert, cooperative, appears stated age, and no acute distress    Lab Review Urine HCG: negative    Assessment:    Absence of menstruation.     Plan:    Pregnancy Test: negative. Beta quant level today. Will follow up with results. If negative offered to repeat in 2 wks. Discussed rept level if elevated , then potentially u/s if pregnancy established. She verbalizes and agrees to plan of care. Will follow up with results.   Philip Aspen, CNM

## 2021-06-30 LAB — BETA HCG QUANT (REF LAB): hCG Quant: 5 m[IU]/mL

## 2021-07-01 ENCOUNTER — Telehealth: Payer: Self-pay | Admitting: Certified Nurse Midwife

## 2021-07-01 NOTE — Progress Notes (Signed)
Pt made aware lab results were negative. No questions/concerns voiced by patient at this time.

## 2021-07-01 NOTE — Telephone Encounter (Signed)
Pt called asking for someone to call her to review lab results. Please Advise.

## 2021-07-01 NOTE — Telephone Encounter (Signed)
Pt made aware lab results were negative. No questions/concerns voiced by patient at this time.

## 2021-07-05 ENCOUNTER — Other Ambulatory Visit: Payer: Self-pay

## 2021-07-05 ENCOUNTER — Telehealth: Payer: Self-pay | Admitting: Psychiatry

## 2021-07-05 ENCOUNTER — Ambulatory Visit (INDEPENDENT_AMBULATORY_CARE_PROVIDER_SITE_OTHER): Payer: BC Managed Care – PPO | Admitting: Psychiatry

## 2021-07-05 DIAGNOSIS — F332 Major depressive disorder, recurrent severe without psychotic features: Secondary | ICD-10-CM | POA: Diagnosis not present

## 2021-07-05 NOTE — Telephone Encounter (Signed)
Tried to contact patient but there was no answer and mailbox was full
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
## 2021-07-06 ENCOUNTER — Ambulatory Visit: Payer: BC Managed Care – PPO | Admitting: Nurse Practitioner

## 2021-07-13 ENCOUNTER — Other Ambulatory Visit: Payer: BC Managed Care – PPO

## 2021-07-19 ENCOUNTER — Telehealth: Payer: Self-pay | Admitting: Psychiatry

## 2021-07-19 ENCOUNTER — Ambulatory Visit: Payer: BC Managed Care – PPO | Admitting: Psychiatry

## 2021-07-19 NOTE — Telephone Encounter (Signed)
Called to check on patient due to her missing appointment

## 2021-08-02 ENCOUNTER — Other Ambulatory Visit: Payer: Self-pay

## 2021-08-02 ENCOUNTER — Ambulatory Visit (INDEPENDENT_AMBULATORY_CARE_PROVIDER_SITE_OTHER): Payer: BC Managed Care – PPO | Admitting: Psychiatry

## 2021-08-02 DIAGNOSIS — F411 Generalized anxiety disorder: Secondary | ICD-10-CM | POA: Diagnosis not present

## 2021-08-02 NOTE — Progress Notes (Signed)
      [facility] Counselor/Therapist Progress Note  Patient ID: Elizabeth Bell, MRN: [id number],    Date: 08/02/2021  Time Spent: 47 minutes start time 2:13 PM end time 3 PM  Treatment Type: Individual Therapy  Reported Symptoms: anxiety, depression, sleep issues, rumination, nightmares, flashbacks, crying spells  Mental Status Exam:  Appearance:   Casual and Neat     Behavior:  Appropriate  Motor:  Normal  Speech/Language:   Normal Rate  Affect:  Appropriate  Mood:  labile  Thought process:  normal  Thought content:    WNL  Sensory/Perceptual disturbances:    WNL  Orientation:  oriented to person, place, time/date, and situation  Attention:  Good  Concentration:  Good  Memory:  WNL  Fund of knowledge:   Good  Insight:    Good  Judgment:   Good  Impulse Control:  Good   Risk Assessment: Danger to Self:  No Self-injurious Behavior: No Danger to Others: No Duty to Warn:no Physical Aggression / Violence:No  Access to Firearms a concern: No  Gang Involvement:No   Subjective: Patient was present for session. She shared her mood was much better overall.  She shared she is started working and is planning on working another job soon as well.  Patient stated she is realizing she needs to focus more on saving money and getting herself to a better place.  Encouraged patient to remember what she has done in the past that was very helpful and discussed ways to implement those strategies in her current situation.  Patient acknowledged that her goal is to get back to [place] where she has friends and family and she has pleasant memories of the past.  Helped her think through a plan that could benefit her and help her move in that direction.  Discussed the importance of self talk and helping her deal with her emotions and keeping focused on her goal.  Interventions: Cognitive Behavioral Therapy and Solution-Oriented/Positive Psychology  Diagnosis:   ICD-10-CM   1. Generalized anxiety  disorder  F41.1       Plan: Patient is to use CBT and coping skills to decrease anxiety.  Patient is to stay focused on working and saving money so that she can get to her goals.  Patient is to set limits with others and focus on her own self-care.  Patient is to consider working with provider again on medication management.  Lina Sayre, [facility]

## 2021-08-13 IMAGING — DX DG CHEST 1V PORT
1 series · 1 of 1 positions shown · non-contrast
Comparison: None.

CLINICAL DATA: Intubated

EXAM:
PORTABLE CHEST 1 VIEW

[chest ap]
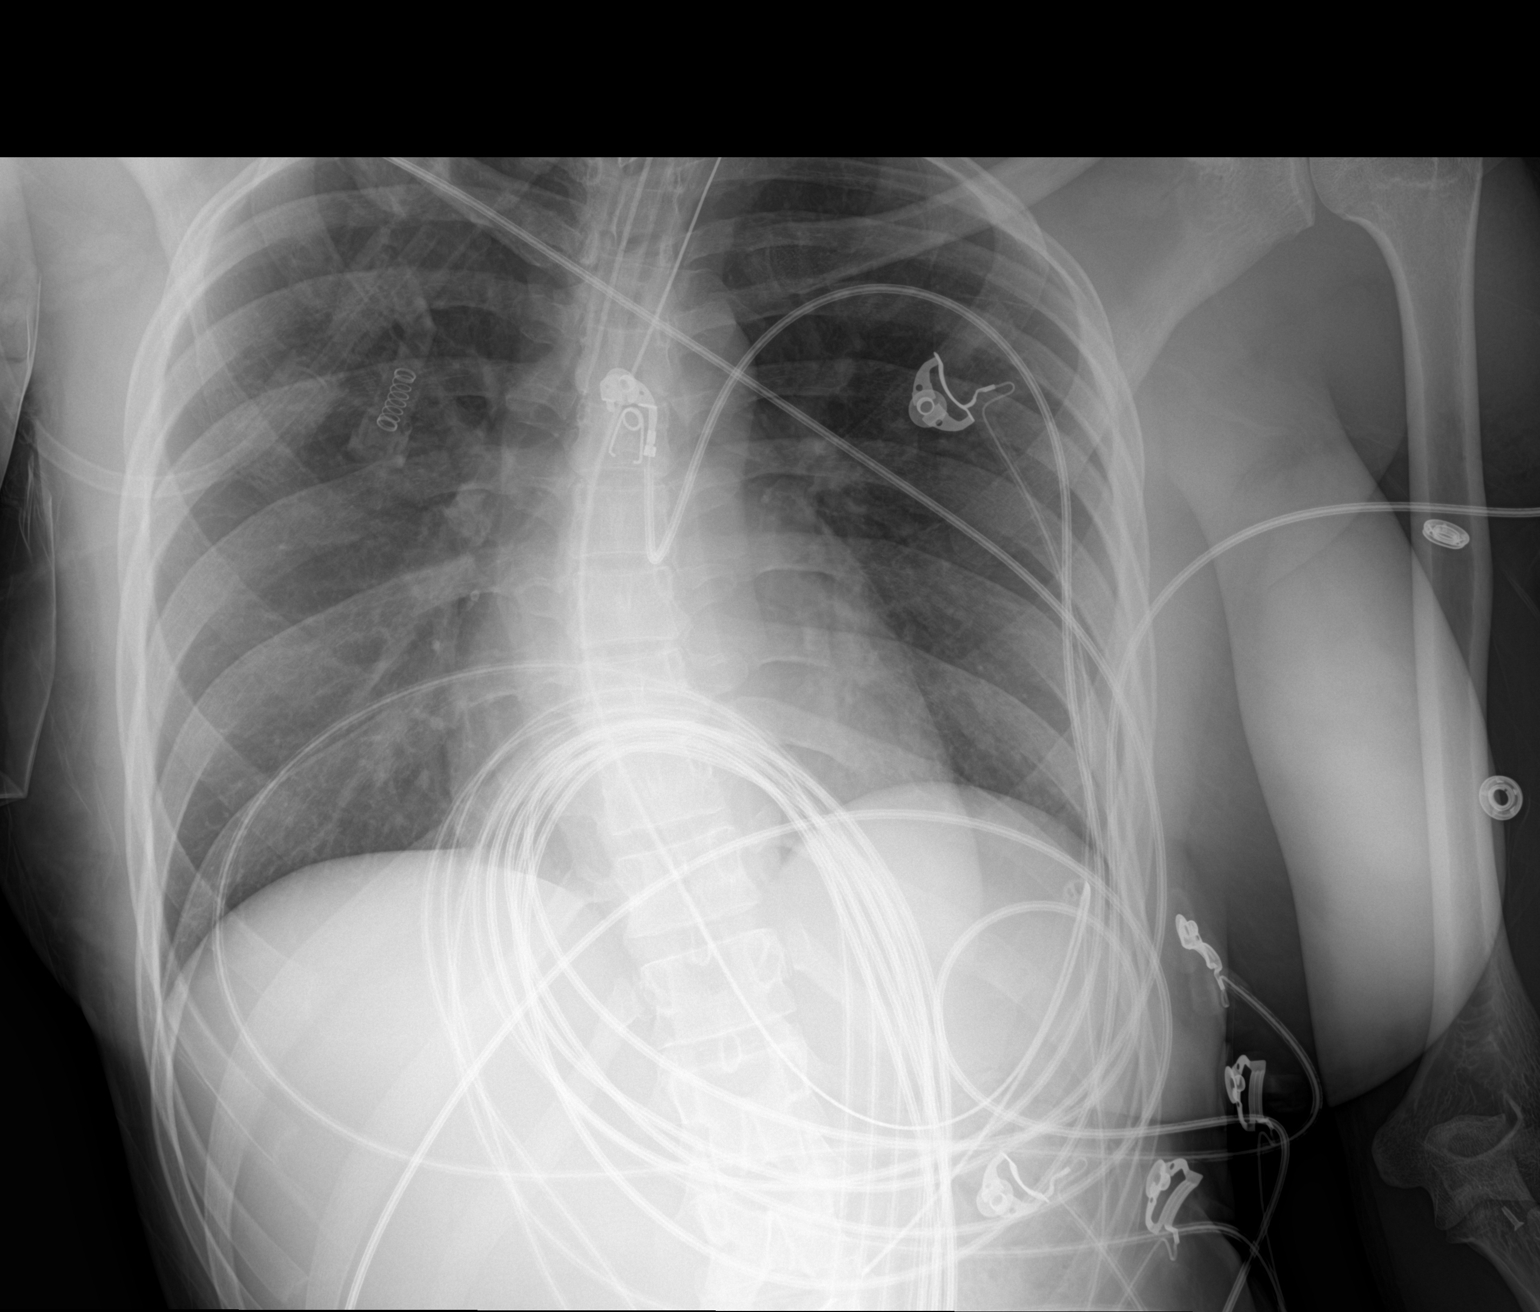

[1 of 1 positions shown; findings below may reference images not displayed]

FINDINGS: Endotracheal tube tip just above the carina. Esophageal tube tip
overlies the stomach. No focal consolidation or effusion. Normal
cardiomediastinal silhouette. No pneumothorax.
IMPRESSION: 1. Endotracheal tube tip just above the carina
2. Esophageal tube tip overlies the stomach
3. Clear lung fields

## 2021-10-04 ENCOUNTER — Ambulatory Visit (INDEPENDENT_AMBULATORY_CARE_PROVIDER_SITE_OTHER): Payer: BC Managed Care – PPO | Admitting: Psychiatry

## 2021-10-04 ENCOUNTER — Other Ambulatory Visit: Payer: Self-pay

## 2021-10-04 DIAGNOSIS — F411 Generalized anxiety disorder: Secondary | ICD-10-CM | POA: Diagnosis not present

## 2021-10-04 NOTE — Progress Notes (Signed)
°      [facility] Counselor/Therapist Progress Note  Patient ID: Elizabeth Bell, MRN: [id number],    Date: 10/04/2021  Time Spent: 28 minutes start time 12:38 PM end time 1:06 PM  Treatment Type: Individual Therapy  Reported Symptoms: anxiety, hallucinations at times, sadness, rumination, focusing issues, sleep issues, eating issues, compulsive behaviors  Mental Status Exam:  Appearance:   Casual and Neat     Behavior:  Appropriate  Motor:  Normal  Speech/Language:   Normal Rate  Affect:  Appropriate  Mood:  anxious  Thought process:  normal  Thought content:    WNL  Sensory/Perceptual disturbances:    WNL  Orientation:  oriented to person, place, time/date, and situation  Attention:  Good  Concentration:  Good  Memory:  WNL  Fund of knowledge:   Fair  Insight:    Fair  Judgment:   Fair  Impulse Control:  Fair   Risk Assessment: Danger to Self:  No Self-injurious Behavior: No Danger to Others: No Duty to Warn:no Physical Aggression / Violence:No  Access to Firearms a concern: No  Gang Involvement:No   Subjective: Patient was late for session due to a flat tire. She shared that things have been hard recently.  She shared that she did reconnect with her ex boyfriend. She is still having car problems and her last job did not work out due to not getting enough hours. She is going to court on the 26th of this month. 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  Patient shared she had followed through on plans and had posted lots of affirmations around the room and was working on her self talk.  Encouraged patient to focus on the things that she can control fix and change.  Discussed the importance of her first finding the job and getting going on reducing her debt and financial stress.  Was then encouraged to figure out what the next step was for her and then go from there.  Patient was encouraged to recognize her strengths and to know that they will get her  through what ever journey she is on.  Patient agreed to continue working on her CBT skills and focusing on self-care.  Interventions: Cognitive Behavioral Therapy and Solution-Oriented/Positive Psychology  Diagnosis:   ICD-10-CM   1. Generalized anxiety disorder  F41.1       Plan: Patient is to use CBT and coping skills to decrease anxiety symptoms.  Patient is to focus on the things that she can control fix and change.  Patient is to work on getting a job as quickly as possible.  Patient is to consider different options for medication management.   Lina Sayre, [facility]

## 2021-10-08 ENCOUNTER — Other Ambulatory Visit: Payer: Self-pay

## 2021-10-08 ENCOUNTER — Encounter: Payer: Self-pay | Admitting: Emergency Medicine

## 2021-10-08 ENCOUNTER — Ambulatory Visit
Admission: EM | Admit: 2021-10-08 | Discharge: 2021-10-08 | Disposition: A | Discharge status: Home / Self Care | Payer: BC Managed Care – PPO | Attending: Emergency Medicine | Admitting: Emergency Medicine

## 2021-10-08 DIAGNOSIS — B349 Viral infection, unspecified: Secondary | ICD-10-CM | POA: Diagnosis present

## 2021-10-08 DIAGNOSIS — Z113 Encounter for screening for infections with a predominantly sexual mode of transmission: Secondary | ICD-10-CM | POA: Insufficient documentation

## 2021-10-08 DIAGNOSIS — Z3202 Encounter for pregnancy test, result negative: Secondary | ICD-10-CM | POA: Diagnosis present

## 2021-10-08 DIAGNOSIS — J029 Acute pharyngitis, unspecified: Secondary | ICD-10-CM | POA: Diagnosis present

## 2021-10-08 LAB — POCT URINALYSIS DIP (MANUAL ENTRY)
Bilirubin, UA: NEGATIVE
Blood, UA: NEGATIVE
Glucose, UA: NEGATIVE mg/dL
Leukocytes, UA: NEGATIVE
Nitrite, UA: NEGATIVE
Protein Ur, POC: NEGATIVE mg/dL
Spec Grav, UA: >=1.03 — AB (ref 1.010–1.025)
Urobilinogen, UA: 0.2 E.U./dL
pH, UA: 5.5 (ref 5.0–8.0)

## 2021-10-08 LAB — POCT RAPID STREP A (OFFICE): Rapid Strep A Screen: NEGATIVE

## 2021-10-08 LAB — POCT URINE PREGNANCY: Preg Test, Ur: NEGATIVE

## 2021-10-08 NOTE — Discharge Instructions (Addendum)
Your strep test is negative.  Your COVID and Flu tests are pending.  You should self quarantine until the test results are back.  Take Tylenol or ibuprofen as needed for fever or discomfort.  Rest and keep yourself hydrated.  Follow-up with your primary care provider if your symptoms are not improving.    Your vaginal tests are pending.  If your test results are positive, we will call you.  Do not have sexual activity for at least 7 days.

## 2021-10-08 NOTE — ED Provider Notes (Signed)
[facility]    CSN: [id number] Arrival date & time: 10/08/21  1518      History   Chief Complaint Chief Complaint  Patient presents with   SEXUALLY TRANSMITTED DISEASE   URI    HPI Elizabeth Bell is a 21 y.o. female.  Accompanied by her sister, patient presents with 1 day history of body aches, sore throat, runny nose, cough.  No fever, rash, wheezing, shortness of breath, abdominal pain, vomiting, diarrhea, or other symptoms.  She also request STD testing and pregnancy test.  No vaginal discharge, pelvic pain, dysuria.  Her medical history includes asthma.    The history is provided by the patient and medical records.   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 No pertinent surgical history.  OB History   No obstetric history on file.      Home Medications    Prior to Admission medications   Medication Sig Start Date End Date Taking? Authorizing Provider  acetaminophen (TYLENOL) 500 MG tablet Take 1,000 mg by mouth every 6 (six) hours as needed for mild pain. Patient not taking: Reported on 06/29/2021    [provider]  albuterol (VENTOLIN HFA) 108 (90 Base) MCG/ACT inhaler Inhale 2 puffs into the lungs every 6 (six) hours as needed for shortness of breath or wheezing. Patient not taking: Reported on 06/29/2021 10/01/20   [provider]  Ascorbic Acid (VITA-C PO) Take by mouth.    [provider]  FLUoxetine (PROZAC) 20 MG capsule Take 1 capsule (20 mg total) by mouth daily. For depression Patient not taking: Reported on 06/29/2021 02/25/21   Lindell Spar I, NP  fluticasone (FLOVENT HFA) 44 MCG/ACT inhaler Inhale 2 puffs into the lungs 2 (two)  times daily. For shortness of breath Patient not taking: Reported on 06/29/2021 02/25/21   Lindell Spar I, NP  hydrOXYzine (ATARAX/VISTARIL) 25 MG tablet Take 1 tablet (25 mg total) by mouth 3 (three) times daily as needed for anxiety. Patient not taking: Reported on 06/29/2021 02/25/21   Lindell Spar I, NP  Prenatal Vit-Fe Fumarate-FA (PRENATAL 19 PO) Take by mouth.    [provider]  SYMBICORT 160-4.5 MCG/ACT inhaler Inhale 1-2 puffs into the lungs 2 (two) times daily. Patient not taking: Reported on 06/29/2021 08/27/20   [provider]  thiamine 100 MG tablet Take 1 tablet (100 mg total) by mouth daily. Patient not taking: Reported on 06/29/2021 02/23/21   Shelly Coss, MD  traZODone (DESYREL) 50 MG tablet Take 1 tablet (50 mg total) by mouth at bedtime as needed for sleep. Patient not taking: Reported on 06/29/2021 02/25/21   Encarnacion Slates, NP    Family History Family History  Problem Relation Age of Onset   Diabetes Mother    Bipolar disorder Sister     Social History Social History   Tobacco Use   Smoking status: Never   Smokeless tobacco: Never  Vaping Use   Vaping Use: Some days  Substance Use Topics   Alcohol use: Yes   Drug use: Not Currently    Types: Marijuana    Comment: 1 week ago     Allergies   Patient has no known allergies.  Review of Systems Review of Systems  Constitutional:  Negative for chills and fever.  HENT:  Positive for rhinorrhea and sore throat. Negative for ear pain.   Respiratory:  Positive for cough. Negative for shortness of breath.   Cardiovascular:  Negative for chest pain and palpitations.  Gastrointestinal:  Negative for abdominal pain, diarrhea and vomiting.  Genitourinary:  Negative for dysuria, hematuria, pelvic pain and vaginal discharge.  Skin:  Negative for color change and rash.  All other systems reviewed and are negative.   Physical Exam Triage Vital Signs ED Triage Vitals  Enc Vitals Group     BP       Pulse      Resp      Temp      Temp src      SpO2      Weight      Height      Head Circumference      Peak Flow      Pain Score      Pain Loc      Pain Edu?      Excl. in [place]?    No data found.  Updated Vital Signs BP 131/84 (BP Location: Left Arm)    Pulse 73    Temp 97.9 F (36.6 C) (Oral)    Resp 18    LMP 09/17/2021    SpO2 98%   Visual Acuity Right Eye Distance:   Left Eye Distance:   Bilateral Distance:    Right Eye Near:   Left Eye Near:    Bilateral Near:     Physical Exam Vitals and nursing note reviewed.  Constitutional:      General: She is not in acute distress.    Appearance: She is well-developed. She is not ill-appearing.  HENT:     Right Ear: Tympanic membrane normal.     Left Ear: Tympanic membrane normal.     Nose: Nose normal.     Mouth/Throat:     Mouth: Mucous membranes are moist.     Pharynx: Oropharynx is clear.  Cardiovascular:     Rate and Rhythm: Normal rate and regular rhythm.     Heart sounds: Normal heart sounds.  Pulmonary:     Effort: Pulmonary effort is normal. No respiratory distress.     Breath sounds: Normal breath sounds.  Abdominal:     General: Bowel sounds are normal.     Palpations: Abdomen is soft.     Tenderness: There is no abdominal tenderness. There is no right CVA tenderness, left CVA tenderness, guarding or rebound.  Musculoskeletal:     Cervical back: Neck supple.  Skin:    General: Skin is warm and dry.  Neurological:     Mental Status: She is alert.  Psychiatric:        Mood and Affect: Mood normal.        Behavior: Behavior normal.     UC Treatments / Results  Labs (all labs ordered are listed, but only abnormal results are displayed) Labs Reviewed  POCT URINALYSIS DIP (MANUAL ENTRY) - Abnormal; Notable for the following components:      Result Value   Ketones, POC UA trace (5) (*)    Spec Grav, UA >=1.030 (*)    All other components within normal limits  COVID-19, FLU A+B NAA  POCT URINE  PREGNANCY  POCT RAPID STREP A (OFFICE)  CERVICOVAGINAL ANCILLARY ONLY    EKG   Radiology No results found.  Procedures Procedures (  including critical care time)  Medications Ordered in UC Medications - No data to display  Initial Impression / Assessment and Plan / UC Course  I have reviewed the triage vital signs and the nursing notes.  Pertinent labs & imaging results that were available during my care of the patient were reviewed by me and considered in my medical decision making (see chart for details).    Viral illness, Sore throat, STD screening, Negative pregnancy test.  Rapid strep negative. COVID and Flu pending.  Instructed patient to self quarantine per CDC guidelines.  Discussed symptomatic treatment including Tylenol or ibuprofen, rest, hydration.  Instructed patient to follow up with PCP if symptoms are not improving.  Patient obtained vaginal self swab for testing.   Discussed that we will call if test results are positive.  Discussed that she may require treatment at that time.  Instructed patient to abstain from sexual activity for at least 7 days.  Patient agrees to plan of care.    Final Clinical Impressions(s) / UC Diagnoses   Final diagnoses:  Viral illness  Sore throat  Screening for STD (sexually transmitted disease)  Negative pregnancy test     Discharge Instructions      Your strep test is negative.  Your COVID and Flu tests are pending.  You should self quarantine until the test results are back.  Take Tylenol or ibuprofen as needed for fever or discomfort.  Rest and keep yourself hydrated.  Follow-up with your primary care provider if your symptoms are not improving.    Your vaginal tests are pending.  If your test results are positive, we will call you.  Do not have sexual activity for at least 7 days.         ED Prescriptions   None    PDMP not reviewed this encounter.   Sharion Balloon, NP 10/08/21 1614

## 2021-10-08 NOTE — ED Triage Notes (Signed)
Body aches, sore throat, reports spots in roof of mouth (yellow and red per sister, fatigue.  Denies fever . Cough, and runny nose.

## 2021-10-10 LAB — CERVICOVAGINAL ANCILLARY ONLY
Bacterial Vaginitis (gardnerella): POSITIVE — AB
Candida Glabrata: NEGATIVE
Candida Vaginitis: NEGATIVE
Chlamydia: NEGATIVE
Comment: NEGATIVE
Comment: NEGATIVE
Comment: NEGATIVE
Comment: NEGATIVE
Comment: NEGATIVE
Comment: NORMAL
Neisseria Gonorrhea: NEGATIVE
Trichomonas: NEGATIVE

## 2021-10-10 LAB — COVID-19, FLU A+B NAA
Influenza A, NAA: NOT DETECTED
Influenza B, NAA: NOT DETECTED
SARS-CoV-2, NAA: NOT DETECTED

## 2021-10-11 ENCOUNTER — Telehealth (HOSPITAL_COMMUNITY): Payer: Self-pay | Admitting: Emergency Medicine

## 2021-10-11 MED ORDER — METRONIDAZOLE 500 MG PO TABS: 500.0000 mg | ORAL_TABLET | Freq: Two times a day (BID) | ORAL | 0 refills | Status: DC

## 2021-10-18 ENCOUNTER — Ambulatory Visit (INDEPENDENT_AMBULATORY_CARE_PROVIDER_SITE_OTHER): Payer: BC Managed Care – PPO | Admitting: Psychiatry

## 2021-10-18 ENCOUNTER — Other Ambulatory Visit: Payer: Self-pay

## 2021-10-18 DIAGNOSIS — F330 Major depressive disorder, recurrent, mild: Secondary | ICD-10-CM

## 2021-10-18 NOTE — Progress Notes (Signed)
°      [facility] Counselor/Therapist Progress Note  Patient ID: Yamaira Spinner, MRN: [id number],    Date: 10/18/2021  Time Spent: 50 minutes start time 3:10 PM end time 4 PM  Treatment Type: Individual Therapy  Reported Symptoms: depression, anxiety, rumination, sleep issues, nightmares  Mental Status Exam:  Appearance:   Casual     Behavior:  Appropriate  Motor:  Normal  Speech/Language:   Normal Rate  Affect:  Appropriate  Mood:  sad  Thought process:  normal  Thought content:    WNL  Sensory/Perceptual disturbances:    WNL  Orientation:  oriented to person, place, time/date, and situation  Attention:  Good  Concentration:  Good  Memory:  WNL  Fund of knowledge:   Fair  Insight:    Fair  Judgment:   Fair  Impulse Control:  Good   Risk Assessment: Danger to Self:  No Self-injurious Behavior: No Danger to Others: No Duty to Warn:no Physical Aggression / Violence:No  Access to Firearms a concern: No  Gang Involvement:No   Subjective: Patient was present for session.  Patient reported that she was doing much better.  She was not having intrusive thoughts or hallucinations.  She shared that she did not get the job and is applying for a new job.  She has realized that she needs to move on from her ex.  She had a STD scare but she is okay.  Patient explained she is starting to see somebody else and that is helping her realize she wants more for herself.  Encouraged patient to think through what she needs to do to start accomplishing goals that she has for herself.  Patient was able to acknowledge that her biggest stressor is finances and a desire to get back into her own apartment and to be more self sufficient.  Discussed what she needs to do to be able to move in that direction.  Patient patient was able to acknowledge that some of her self-care habits are not healthy.  Discussed how to talk herself through making better choices and ways to change the negative habits for +ones.   Patient recognized that when she is exercising she does much better and stated she wanted to get back to the gym since she had lost a lot of weight as well.    Interventions: Cognitive Behavioral Therapy and Solution-Oriented/Positive Psychology  Diagnosis:   ICD-10-CM   1. MDD (major depressive disorder), recurrent episode, mild ([place])  F33.0       Plan: Patient is to use CBT and coping skills to decrease anxiety symptoms.  Patient is to continue working on finding jobs.  Patient is to release negative emotions appropriately through exercise.  Patient is to work on talking herself through making positive choices with her self-care.  Patient is to keep working with medical providers on health issues  Lina Sayre, [facility]

## 2021-11-01 ENCOUNTER — Other Ambulatory Visit: Payer: Self-pay

## 2021-11-01 ENCOUNTER — Ambulatory Visit (INDEPENDENT_AMBULATORY_CARE_PROVIDER_SITE_OTHER): Payer: BC Managed Care – PPO | Admitting: Psychiatry

## 2021-11-01 DIAGNOSIS — F411 Generalized anxiety disorder: Secondary | ICD-10-CM

## 2021-11-01 NOTE — Progress Notes (Signed)
°      [facility] Counselor/Therapist Progress Note  Patient ID: Elizabeth Bell, MRN: [id number],    Date: 11/01/2021  Time Spent: 28 minutes start time 3:36 PM end time 4:02 PM  Treatment Type: Individual Therapy  Reported Symptoms: anxiety,sadness, melt down, eating issues, sleep issues  Mental Status Exam:  Appearance:   Casual     Behavior:  Appropriate  Motor:  Normal  Speech/Language:   Normal Rate  Affect:  Appropriate  Mood:  anxious  Thought process:  circumstantial  Thought content:    WNL  Sensory/Perceptual disturbances:    WNL  Orientation:  oriented to person, place, time/date, and situation  Attention:  Good  Concentration:  Good  Memory:  WNL  Fund of knowledge:   Fair  Insight:    Fair  Judgment:   Good  Impulse Control:  Fair   Risk Assessment: Danger to Self:  No Self-injurious Behavior: No Danger to Others: No Duty to Warn:no Physical Aggression / Violence:No  Access to Firearms a concern: No  Gang Involvement:No   Subjective: Patient was late for session due to traffic.  She shared she went to a new doctor and talked to her about some behavioral services.  She shared she would be able to participate. Patient shared she had a melt down due to her car being repossessed.  She explained she is starting to work at [employer] again and she is still talking to the new guy.  Patient shared she realizes she needs to get on a schedule.  She reported she does better when she is working so she is happy about that.  Discussed different ways that she could relax and ground herself.  Also discussed doing some brain spotting relaxation exercises.  Shared she did not want to be back on medication and encouraged her to realize with the things that she was sharing probably taking medication at least short-term would be most helpful so she agreed to start the medication that had been prescribed.  Interventions: Cognitive Behavioral Therapy and Solution-Oriented/Positive  Psychology  Diagnosis:   ICD-10-CM   1. Generalized anxiety disorder  F41.1       Plan: Patient is to use CBT and coping skills to decrease anxiety symptoms.  Patient is to continue working on finding jobs.  Patient is to release negative emotions appropriately through exercise.  Patient is to work on talking herself through making positive choices with her self-care.  Patient is to keep working with medical providers on health issues.  Patient is to take medication as directed.  Patient is to start journaling to release negative emotions appropriately Long-term goal: Enhance ability to handle effectively the full variety of life's anxieties Short-term goal: Identify the major life complex in the past and present the form the basis for present anxiety  Lina Sayre, [facility]

## 2022-02-18 ENCOUNTER — Ambulatory Visit (HOSPITAL_COMMUNITY)
Admission: AD | Admit: 2022-02-18 | Discharge: 2022-02-18 | Disposition: A | Discharge status: Home / Self Care | Payer: BC Managed Care – PPO | Attending: Psychiatry | Admitting: Psychiatry

## 2022-02-18 DIAGNOSIS — Z20822 Contact with and (suspected) exposure to covid-19: Secondary | ICD-10-CM | POA: Insufficient documentation

## 2022-02-18 DIAGNOSIS — F332 Major depressive disorder, recurrent severe without psychotic features: Secondary | ICD-10-CM | POA: Insufficient documentation

## 2022-02-18 DIAGNOSIS — R45851 Suicidal ideations: Secondary | ICD-10-CM | POA: Insufficient documentation

## 2022-02-18 DIAGNOSIS — F1290 Cannabis use, unspecified, uncomplicated: Secondary | ICD-10-CM | POA: Insufficient documentation

## 2022-02-18 DIAGNOSIS — Z9151 Personal history of suicidal behavior: Secondary | ICD-10-CM | POA: Insufficient documentation

## 2022-02-18 DIAGNOSIS — Z9152 Personal history of nonsuicidal self-harm: Secondary | ICD-10-CM | POA: Insufficient documentation

## 2022-02-18 DIAGNOSIS — F1090 Alcohol use, unspecified, uncomplicated: Secondary | ICD-10-CM | POA: Insufficient documentation

## 2022-02-19 ENCOUNTER — Ambulatory Visit (HOSPITAL_COMMUNITY)
Admission: EM | Admit: 2022-02-19 | Discharge: 2022-02-19 | Disposition: A | Discharge status: Home / Self Care | Payer: BC Managed Care – PPO

## 2022-02-19 DIAGNOSIS — F1090 Alcohol use, unspecified, uncomplicated: Secondary | ICD-10-CM | POA: Diagnosis not present

## 2022-02-19 DIAGNOSIS — F332 Major depressive disorder, recurrent severe without psychotic features: Secondary | ICD-10-CM

## 2022-02-19 DIAGNOSIS — R45851 Suicidal ideations: Secondary | ICD-10-CM | POA: Diagnosis not present

## 2022-02-19 DIAGNOSIS — Z20822 Contact with and (suspected) exposure to covid-19: Secondary | ICD-10-CM | POA: Diagnosis not present

## 2022-02-19 DIAGNOSIS — Z9151 Personal history of suicidal behavior: Secondary | ICD-10-CM | POA: Diagnosis not present

## 2022-02-19 DIAGNOSIS — F1290 Cannabis use, unspecified, uncomplicated: Secondary | ICD-10-CM | POA: Diagnosis not present

## 2022-02-19 DIAGNOSIS — Z9152 Personal history of nonsuicidal self-harm: Secondary | ICD-10-CM | POA: Diagnosis not present

## 2022-02-19 LAB — SARS CORONAVIRUS 2 BY RT PCR: SARS Coronavirus 2 by RT PCR: NEGATIVE

## 2022-02-19 NOTE — ED Provider Notes (Signed)
Behavioral Health Urgent Care Medical Screening Exam  Patient Name: Elizabeth Bell MRN: [id number] Date of Evaluation: 02/20/22 Chief Complaint:   Diagnosis:  Final diagnoses:  Severe episode of recurrent major depressive disorder, without psychotic features ([place])    History of Present illness: Elizabeth Bell is a 21 y.o. female presenting voluntarily to [facility] for anxiety and depression symptoms after presenting to [facility].  TTS and Psych NP assessed patient and recommended that patient be transferred to [facility] for continuous assessment due to no available or appropriate bed available at [facility]. Once patient arrived at [facility], patient she was no longer having any SI and did not want inpatient treatment or remain at [facility] and requested to e discharged. Patient was alert oriented x4, calm and cooperative with euthymic mood and congruent affect. Patient does not appear to be responding to any internal or external stimuli at this time. Patient is able to contract for safety and will be discharged home with community resources for medication management and therapy.   Elizabeth Bell is an 21 y.o. female with psychiatric history of anxiety, depression, and alcohol abuse.  Patient presented voluntarily and unaccompanied to [facility] reporting worsening depressive symptoms, suicidal ideation, and alcohol abuse.     Patient was seen face-to-face and her chart was reviewed by this nurse practitioner.  She is alert and oriented x4; patient is irritable and minimally cooperative. Patient's mood is irritable and her thought process is coherent.   Patient reports that she came to the hospital tonight because she is unable to maintain her safety at home. She says she is suicidal with a plan to "walk into the nearest highway." Patient reports she has been having suicidal thoughts for the past few days.  Patient says that she has been feeling depressed over the past 2  months.  Patient is unwilling to discuss triggers at this time; patient states "I do not want to talk about it right now." She reports that she has been drinking alcohol daily to cope with depressed mood.  Patient reports drinking 2 shots of [facility] daily for the past 1 month.  She reports drinking "a large bottle (24 ounce bottle) and a medium (16 ounce) bottle of [employer] wine prior to this assessment."  Patient is endorsing depressive symptoms of social isolation, loss of interest in usual pleasure, crying spells, hopelessness, worthlessness, poor motivation, poor appetite, poor sleep, poor focus, and irritability.   Patient continued to endorse active suicidal ideation.  Patient shares she has a history of 1 suicidal attempt via alcohol poisoning in July 2022 which resulted in psychiatric hospitalization.  Patient also has a history of self-harming behavior via cutting.  Patient states she last engaged in self harming in July 2022.  Patient denies homicidal ideation, auditory/visual hallucination, and paranoia.  Patient admits to smoking 1/2 blunts of marijuana 3 times per month.  Patient denies all other substance use.  Patient reports that she lives at home with mother, father, and sister however she says she prefers to live with a family friend (patient could not provide further information about this friend).  Psychiatric Specialty Exam  Presentation  General Appearance:Casual  Eye Contact:Good  Speech:Clear and Coherent  Speech Volume:Normal  Handedness:Right   Mood and Affect  Mood:Euthymic  Affect:Congruent   Thought Process  Thought Processes:Coherent  Descriptions of Associations:Intact  Orientation:Full (Time, Place and Person)  Thought Content:WDL  Diagnosis of Schizophrenia or Schizoaffective disorder in past: No  Hallucinations:None  Ideas of Reference:None  Suicidal Thoughts:No Without Intent; Without Plan  Homicidal Thoughts:No   Sensorium   Memory:Immediate Good; Recent Good; Remote Good  Judgment:Fair  Insight:Good   Executive Functions  Concentration:Good  Attention Span:Good  Geraldine  Language:Good   Psychomotor Activity  Psychomotor Activity:Normal   Assets  Assets:Communication Skills; Physical Health; Social Support; Catering manager   Sleep  Sleep:Fair  Number of hours: -1   No data recorded  Physical Exam: Physical Exam HENT:     Head: Normocephalic and atraumatic.     Nose: Nose normal.  Eyes:     Pupils: Pupils are equal, round, and reactive to light.  Cardiovascular:     Rate and Rhythm: Normal rate.  Pulmonary:     Effort: Pulmonary effort is normal.  Abdominal:     General: Abdomen is flat.  Musculoskeletal:        General: Normal range of motion.     Cervical back: Normal range of motion.  Skin:    General: Skin is warm.  Neurological:     Mental Status: She is alert and oriented to person, place, and time.  Psychiatric:        Attention and Perception: Attention normal.        Mood and Affect: Mood normal.        Speech: Speech normal.        Behavior: Behavior normal.        Thought Content: Thought content is not paranoid or delusional. Thought content does not include homicidal or suicidal ideation. Thought content does not include homicidal or suicidal plan.        Cognition and Memory: Cognition normal.        Judgment: Judgment normal.   Review of Systems  Constitutional: Negative.   HENT: Negative.    Eyes: Negative.   Respiratory: Negative.    Cardiovascular: Negative.   Gastrointestinal: Negative.   Genitourinary: Negative.   Musculoskeletal: Negative.   Skin: Negative.   Neurological: Negative.   Endo/Heme/Allergies: Negative.   Psychiatric/Behavioral:  The patient is nervous/anxious.   Blood pressure (!) 124/92, pulse 86, temperature 98.7 F (37.1 C), temperature source Tympanic, resp. rate 18, SpO2 100 %.  There is no height or weight on file to calculate BMI.  Musculoskeletal: Strength & Muscle Tone: within normal limits Gait & Station: normal Patient leans: N/A   [place] MSE Discharge Disposition for Follow up and Recommendations: Based on my evaluation the patient does not appear to have an emergency medical condition and can be discharged with resources and follow up care in outpatient services for Medication Management and Individual Therapy   Lucia Bitter, NP 02/20/2022, 6:32 AM

## 2022-02-19 NOTE — BH Assessment (Addendum)
Comprehensive Clinical Assessment (CCA) Note  02/19/2022 Elizabeth Bell [id number] Disposition: Pt was seen by this clinician and [name], NP.  Ene did the MSE.  Pt is recommended for continuous observation at [facility]   Patient does not have good eye contact.  She has her coat over her face during assessment.  She has been staying with someone for the last few days and they brought her to [facility].  Pt normally lives with her parents and sister.  Pt is oriented x4.  She is not responding to internal stimuli.  Pt does not evidence any delusional thought issues.  Pt says she sleeps about 4-5 hours a day.  She is always tired.  Pt reports appetite to be WNL.  Pt can express herself clearly and coherently.  Pt admits to drinking about 24oz wine prior to arrival.  Pt has a outpatient therapist named "Miss. April" She cannot recall name of the practice.  Pt was at [facility] in June '22.     Chief Complaint:  Chief Complaint  Patient presents with   Suicidal   Visit Diagnosis: MDD single episode severe    CCA Screening, Triage and Referral (STR)  Patient Reported Information How did you hear about [place]? Family/Friend (Pt was brought to [facility] by a family frirend.)  What Is the Reason for Your Visit/Call Today? Pt was brought to [facility] by a family friend.  She has thoughts of killing herself by running into traffic on the highway.  Patient had a previous suicide attempt by trying ETOH poisoning.  Pt engages in self harm by cutting, last incident was in July.  Pt has no HI or A/V hallucinations.  Pt reports having vivid dreams.  Pt drank two bottles of wine [facility]) prior to arrival.  Pt drank about 24 oz of wine tonight.  Pt reports no access to guns.  Sleep and appetite are WNL.  How Long Has This Been Causing You Problems? 1-6 months  What Do You Feel Would Help You the Most Today? Treatment for Depression or other mood problem   Have You Recently Had Any Thoughts About Hurting Yourself? Yes  Are You  Planning to Commit Suicide/Harm Yourself At This time? Yes   Have you Recently Had Thoughts About Hurting Someone [name]? No  Are You Planning to Harm Someone at This Time? No  Explanation: No data recorded  Have You Used Any Alcohol or Drugs in the Past 24 Hours? Yes  How Long Ago Did You Use Drugs or Alcohol? No data recorded What Did You Use and How Much? Drank about 24oz of wine PTA   Do You Currently Have a Therapist/Psychiatrist? Yes  Name of Therapist/Psychiatrist: "Miss April" a therapist   Have You Been Recently Discharged From Any Office Practice or Programs? No  Explanation of Discharge From Practice/Program: No data recorded    CCA Screening Triage Referral Assessment Type of Contact: Face-to-Face  Telemedicine Service Delivery:   Is this Initial or Reassessment? No data recorded Date Telepsych consult ordered in CHL:  No data recorded Time Telepsych consult ordered in [facility]L:  No data recorded Location of Assessment: [facility]  Provider Location: [facility]   Collateral Involvement: No data recorded  Does Patient Have a [employer]? No data recorded Name and Contact of Legal Guardian: No data recorded If Minor and Not Living with Parent(s), Who has Custody? No data recorded Is CPS involved or ever been involved? Never  Is APS involved or ever  been involved? No data recorded  Patient Determined To Be At Risk for Harm To Self or Others Based on Review of Patient Reported Information or Presenting Complaint? Yes, for Self-Harm  Method: No data recorded Availability of Means: No data recorded Intent: No data recorded Notification Required: No data recorded Additional Information for Danger to Others Potential: No data recorded Additional Comments for Danger to Others Potential: No data recorded Are There Guns or Other Weapons in Your Home? No data recorded Types of Guns/Weapons: No data recorded Are These  Weapons Safely Secured?                            No data recorded Who Could Verify You Are Able To Have These Secured: No data recorded Do You Have any Outstanding Charges, Pending Court Dates, Parole/Probation? No data recorded Contacted To Inform of Risk of Harm To Self or Others: No data recorded   Does Patient Present under Involuntary Commitment? No  IVC Papers Initial File Date: No data recorded  [place] of Residence: [place]   Patient Currently Receiving the Following Services: Individual Therapy   Determination of Need: Urgent (48 hours)   Options For Referral: [facility]     CCA Biopsychosocial Patient Reported Schizophrenia/Schizoaffective Diagnosis in Past: No   Strengths: I to work, read and draw.   Mental Health Symptoms Depression:   Tearfulness; Hopelessness; Difficulty Concentrating; Worthlessness   Duration of Depressive symptoms:  Duration of Depressive Symptoms: Greater than two weeks   Mania:   None   Anxiety:    Restlessness; Worrying; Tension; Difficulty concentrating   Psychosis:   None   Duration of Psychotic symptoms:    Trauma:   Avoids reminders of event   Obsessions:   Good insight   Compulsions:   Good insight; Intended to reduce stress or prevent another outcome   Inattention:   None   Hyperactivity/Impulsivity:   None   Oppositional/Defiant Behaviors:   None   Emotional Irregularity:   Chronic feelings of emptiness   Other Mood/Personality Symptoms:  No data recorded   Mental Status Exam Appearance and self-care  Stature:   Small   Weight:   Average weight   Clothing:   Casual   Grooming:   Normal   Cosmetic use:   None   Posture/gait:   Normal   Motor activity:   Not Remarkable   Sensorium  Attention:   Distractible   Concentration:   Anxiety interferes   Orientation:   X5   Recall/memory:   Normal   Affect and Mood  Affect:   Anxious; Depressed   Mood:   Depressed    Relating  Eye contact:   Avoided (Putting her coat over her face.)   Facial expression:   Depressed   Attitude toward examiner:   Cooperative   Thought and Language  Speech flow:  Clear and Coherent   Thought content:   Appropriate to Mood and Circumstances   Preoccupation:   None   Hallucinations:   None   Organization:  No data recorded  [employer] of Knowledge:   Average   Intelligence:   Average   Abstraction:   Functional   Judgement:   Poor   Reality Testing:   Adequate   Insight:   Fair   Decision Making:   Impulsive   Social Functioning  Social Maturity:   Impulsive   Social Judgement:   Heedless  Stress  Stressors:   Museum/gallery curator; Relationship   Coping Ability:   Overwhelmed   Skill Deficits:   Decision making   Supports:   Family; Friends/Service system     Religion:    Leisure/Recreation:    Exercise/Diet: Exercise/Diet Do You Follow a Special Diet?: No Do You Have Any Trouble Sleeping?: Yes Explanation of Sleeping Difficulties: 3-5 hours.   CCA Employment/Education Employment/Work Situation: Employment / Work Situation Employment Situation: Employed Patient's Job has Been Impacted by Current Illness: No Has Patient ever Been in Passenger transport manager?: No  Education: Education Is Patient Currently Attending School?: No Last Grade Completed: 80 Did You Nutritional therapist?: Yes What Type of College Degree Do you Have?: Two semesters of community college   CCA Family/Childhood History Family and Relationship History: Family history Marital status: Single Does patient have children?: No  Childhood History:  Childhood History By whom was/is the patient raised?: Both parents, Grandparents Did patient suffer any verbal/emotional/physical/sexual abuse as a child?: No Did patient suffer from severe childhood neglect?: No Has patient ever been sexually abused/assaulted/raped as an adolescent or adult?:  No Witnessed domestic violence?: Yes Has patient been affected by domestic violence as an adult?: No Description of domestic violence: Does not wish to talk about it.  Child/Adolescent Assessment:     CCA Substance Use Alcohol/Drug Use: Alcohol / Drug Use Pain Medications: None Prescriptions: Has prescriptions "but I'm not taking." Over the Counter: Tumeric, Cranberry History of alcohol / drug use?: Yes Withdrawal Symptoms: None Substance #1 Name of Substance 1: ETOH (wine) 1 - Age of First Use: unknown 1 - Amount (size/oz): Dranks about 16-25 oz daily 1 - Frequency: Daily 1 - Duration: For the last 1 month 1 - Last Use / Amount: Tonight 1 - Method of Aquiring: purchase 1- Route of Use: oral Substance #2 Name of Substance 2: Marijuana 2 - Age of First Use: unknown 2 - Amount (size/oz): Varies 2 - Frequency: 3-5 times in a week 2 - Duration: ongoing 2 - Last Use / Amount: 05/27 2 - Method of Aquiring: purchase 2 - Route of Substance Use: inhalation                     ASAM's:  Six Dimensions of Multidimensional Assessment  Dimension 1:  Acute Intoxication and/or Withdrawal Potential:      Dimension 2:  Biomedical Conditions and Complications:      Dimension 3:  Emotional, Behavioral, or Cognitive Conditions and Complications:     Dimension 4:  Readiness to Change:     Dimension 5:  Relapse, Continued use, or Continued Problem Potential:     Dimension 6:  Recovery/Living Environment:     ASAM Severity Score:    ASAM Recommended Level of Treatment:     Substance use Disorder (SUD)    Recommendations for Services/Supports/Treatments:    Discharge Disposition:    DSM5 Diagnoses: Patient Active Problem List   Diagnosis Date Noted   MDD (major depressive disorder), recurrent severe, without psychosis ([place]) 02/23/2021   Major depressive disorder, recurrent episode, severe ([place]) 02/20/2021   Acute encephalopathy 02/19/2021     Referrals to  Alternative Service(s): Referred to Alternative Service(s):   Place:   Date:   Time:    Referred to Alternative Service(s):   Place:   Date:   Time:    Referred to Alternative Service(s):   Place:   Date:   Time:    Referred to Alternative Service(s):   Place:   Date:  Time:     Raymondo Band, LCAS

## 2022-02-19 NOTE — H&P (Signed)
Behavioral Health Medical Screening Exam  Elizabeth Bell is an 21 y.o. female with psychiatric history of anxiety, depression, and alcohol abuse.  Patient presented voluntarily and unaccompanied to [facility] reporting worsening depressive symptoms, suicidal ideation, and alcohol abuse.    Patient was seen face-to-face and her chart was reviewed by this nurse practitioner.  She is alert and oriented x4; patient is irritable and minimally cooperative. Patient's mood is irritable and her thought process is coherent.  Patient reports that she came to the hospital tonight because she is unable to maintain her safety at home. She says she is suicidal with a plan to "walk into the nearest highway." Patient reports she has been having suicidal thoughts for the past few days.  Patient says that she has been feeling depressed over the past 2 months.  Patient is unwilling to discuss triggers at this time; patient states "I do not want to talk about it right now." She reports that she has been drinking alcohol daily to cope with depressed mood.  Patient reports drinking 2 shots of [facility] daily for the past 1 month.  She reports drinking "a large bottle (24 ounce bottle) and a medium (16 ounce) bottle of [employer] wine prior to this assessment."  Patient is endorsing depressive symptoms of social isolation, loss of interest in usual pleasure, crying spells, hopelessness, worthlessness, poor motivation, poor appetite, poor sleep, poor focus, and irritability.  Patient continued to endorse active suicidal ideation.  Patient shares she has a history of 1 suicidal attempt via alcohol poisoning in July 2022 which resulted in psychiatric hospitalization.  Patient also has a history of self-harming behavior via cutting.  Patient states she last engaged in self harming in July 2022.  Patient denies homicidal ideation, auditory/visual hallucination, and paranoia.  Patient admits to smoking 1/2 blunts  of marijuana 3 times per month.  Patient denies all other substance use.  Patient reports that she lives at home with mother, father, and sister however she says she prefers to live with a family friend (patient could not provide further information about this friend).   Total Time spent with patient: 20 minutes  Psychiatric Specialty Exam: Physical Exam Constitutional:      General: She is not in acute distress.    Appearance: Normal appearance. She is not ill-appearing, toxic-appearing or diaphoretic.  HENT:     Head: Normocephalic.     Nose: Nose normal.  Eyes:     Conjunctiva/sclera: Conjunctivae normal.  Pulmonary:     Effort: Pulmonary effort is normal. No respiratory distress.     Breath sounds: No wheezing.  Musculoskeletal:        General: Normal range of motion.     Cervical back: Normal range of motion.  Skin:    General: Skin is warm.  Neurological:     Mental Status: She is alert and oriented to person, place, and time.  Psychiatric:        Attention and Perception: Attention and perception normal.        Speech: Speech normal.        Behavior: Behavior normal. Behavior is cooperative.        Thought Content: Thought content normal.   Review of Systems  Constitutional: Negative.   HENT: Negative.    Eyes: Negative.   Respiratory: Negative.    Cardiovascular: Negative.   Gastrointestinal: Negative.   Endocrine: Negative.   Genitourinary: Negative.   Musculoskeletal: Negative.   Neurological: Negative.   Psychiatric/Behavioral:  Positive for suicidal ideas.   There were no vitals taken for this visit.There is no height or weight on file to calculate BMI. General Appearance: Well Groomed Eye Contact:  Minimal Speech:  Clear and Coherent Volume:  Normal Mood:  Depressed, Hopeless, Irritable, and Worthless Affect:  Congruent Thought Process:  Coherent Orientation:  Full (Time, Place, and Person) Thought Content:  WDL Suicidal Thoughts:  Yes.  with  intent/plan Homicidal Thoughts:  No Memory:  Immediate;   Good Recent;   Good Remote;   Good Judgement:  Fair Insight:  Good Psychomotor Activity:  Normal Concentration: Concentration: Good and Attention Span: Good Recall:  Good Fund of Knowledge:Good Language: Good Akathisia:  No Handed:  Right AIMS (if indicated):    Assets:  Communication Skills Desire for Improvement Physical Health Social Support Sleep:     Musculoskeletal: Strength & Muscle Tone: within normal limits Gait & Station: normal Patient leans: Right  There were no vitals taken for this visit.  Recommendations: Patient is unable to contract for safety at this time; patient will be transferred to [facility] for continues assessment with follw-up by psychiatry on 02/19/22 day shift.   Ophelia Shoulder, NP 02/19/2022, 12:54 AM

## 2022-02-19 NOTE — ED Notes (Signed)
Pt discharged in no acute distress. Denied SI/HI/AVH. A&O x4, ambulatory. Verbalized understanding of AVS instructions. Belongings returned to pt intact. Pt escorted to front lobby by staff and security. Safety maintained.

## 2022-02-19 NOTE — Discharge Instructions (Addendum)

## 2022-02-19 NOTE — ED Notes (Signed)
Pt requesting to leave. Quintella Reichert, NP notified.

## 2022-05-10 ENCOUNTER — Telehealth: Payer: Self-pay | Admitting: Psychiatry

## 2022-05-10 NOTE — Telephone Encounter (Signed)
Return patient called.  Left her a message because there was no answer.  Major patient needs that clinician would be out of the office until Monday after today.

## 2022-05-24 ENCOUNTER — Ambulatory Visit (INDEPENDENT_AMBULATORY_CARE_PROVIDER_SITE_OTHER): Payer: BC Managed Care – PPO | Admitting: Psychiatry

## 2022-05-24 DIAGNOSIS — F411 Generalized anxiety disorder: Secondary | ICD-10-CM | POA: Diagnosis not present

## 2022-05-24 NOTE — Progress Notes (Signed)
      [facility] Counselor/Therapist Progress Note  Patient ID: Elizabeth Bell, MRN: [id number],    Date: 05/24/2022  Time Spent: 44 minutes start time 3:16 PM end time 4 PM  Treatment Type: Individual Therapy  Reported Symptoms: anxiety, sadness, intrusive thoughts, focusing issues, sleep issues  Mental Status Exam:  Appearance:   Casual     Behavior:  Appropriate  Motor:  Normal  Speech/Language:   Normal Rate  Affect:  Appropriate  Mood:  anxious  Thought process:  circumstantial  Thought content:    WNL  Sensory/Perceptual disturbances:    WNL  Orientation:  oriented to person, place, time/date, and situation  Attention:  Good  Concentration:  Good  Memory:  WNL  Fund of knowledge:   Fair  Insight:    Fair  Judgment:   Fair  Impulse Control:  Good   Risk Assessment: Danger to Self:  No Self-injurious Behavior: No Danger to Others: No Duty to Warn:no Physical Aggression / Violence:No  Access to Firearms a concern: No  Gang Involvement:No   Subjective: Patient was present for session.  She shared that since she saw clinician a lot has happened.  She found a lump in her left breast she was treated for it. She was working during that time, but she had surgery to remove it and ended up loosing her job.  She is starting a new job on 06/05/2022. She shared she is trying to focus on herself even though she has talked with her ex boyfriend. She shared she is trying to keep her mind engaged in positive things. She is living with her family but wanting to move out on her own again.  Patient was encouraged to think through what she can currently work on to start moving herself in a more positive direction.  She was given handouts on what she can control and what she cannot and encouraged to start recognizing those things.  She was also given handouts on how to manage stress appropriately and how to recognize when the stress is getting to be too much.  Patient agreed to start working  on coping skills discussed in session.  Interventions: Cognitive Behavioral Therapy, Solution-Oriented/Positive Psychology, and Insight-Oriented  Diagnosis:   ICD-10-CM   1. Generalized anxiety disorder  F41.1       Plan: Patient is to use CBT and coping skills to decrease anxiety symptoms.  Patient is to review and practice handouts discussed last given to her in session.  Patient is to release negative emotions appropriately through exercise.  Patient is to work on talking herself through making positive choices with her self-care.  Patient is to keep working with medical providers on health issues.  Patient is to take medication as directed.  Patient is to start journaling to release negative emotions appropriately Long-term goal: Enhance ability to handle effectively the full variety of life's anxieties Short-term goal: Identify the major life complex in the past and present the form the basis for present anxiety    Lina Sayre, [facility]

## 2022-06-01 ENCOUNTER — Ambulatory Visit (INDEPENDENT_AMBULATORY_CARE_PROVIDER_SITE_OTHER): Payer: BC Managed Care – PPO | Admitting: Psychiatry

## 2022-06-01 DIAGNOSIS — F411 Generalized anxiety disorder: Secondary | ICD-10-CM | POA: Diagnosis not present

## 2022-06-01 NOTE — Progress Notes (Signed)
[facility] Counselor/Therapist Progress Note  Patient ID: Elizabeth Bell, MRN: [id number],    Date: 06/01/2022  Time Spent: 41 minutes start time 12:36 PM end time 1:17 PM  Treatment Type: Individual Therapy  Reported Symptoms: anxiety, sadness, sleep issues, rumination, focusing issues, intrusive thoughts  Mental Status Exam:  Appearance:   Casual     Behavior:  Appropriate  Motor:  Restlestness  Speech/Language:   Normal Rate  Affect:  Appropriate  Mood:  anxious  Thought process:  normal  Thought content:    WNL  Sensory/Perceptual disturbances:    WNL  Orientation:  oriented to person, place, time/date, and situation  Attention:  Good  Concentration:  Good  Memory:  WNL  Fund of knowledge:   Good  Insight:    Good  Judgment:   Good  Impulse Control:  Good   Risk Assessment: Danger to Self:  No Self-injurious Behavior: No Danger to Others: No Duty to Warn:no Physical Aggression / Violence:No  Access to Firearms a concern: No  Gang Involvement:No   Subjective: Patient was late for session.  Patient shared that she had been using the handouts from last session and found them to be helpful.  She had shared some with her mother and they were work on on things together.  Patient explained she is still having lots of stressful situations and trying to figure out transportation and different things in her life.  Patient explained that she is starting her new job on the 11th and is feeling lots of anxiety about that possibility.  Patient was encouraged to think through what her anxieties were and different things that she could be saying to herself or thinking through her to help her be able to manage them appropriately.  Patient was able to think about several different things that she could do to help her stay more grounded during her time at work.  Patient shared the visual of getting on a plane and going to [place] when she needs to was helpful over the past week and she  could do that.  Patient went on to share that the other issue is she still having contact with her ex-boyfriend who got another girl pregnant and was the person who got her pregnant with the baby she miscarried.  Patient was allowed time to discuss the situation and was finally able to realize that she does not need contact with them at this time.  Helped her think through what she needed to tell him so she could move on with her life.  Interventions: Cognitive Behavioral Therapy, Solution-Oriented/Positive Psychology, and Insight-Oriented  Diagnosis:   ICD-10-CM   1. Generalized anxiety disorder  F41.1       Plan: Patient is to use CBT and coping skills to decrease anxiety symptoms.  Patient is to review and practice handouts discussed at last  session.  Patient is to release negative emotions appropriately through exercise.  Patient is to work on talking herself through making positive choices with her self-care.  Patient is to keep working with medical providers on health issues.  Patient is to take medication as directed.  Patient is to start journaling to release negative emotions appropriately Long-term goal: Enhance ability to handle effectively the full variety of life's anxieties Short-term goal: Identify the major life complex in the past and present the form the basis for present anxiety    Lina Sayre, [facility]

## 2022-07-25 ENCOUNTER — Ambulatory Visit (INDEPENDENT_AMBULATORY_CARE_PROVIDER_SITE_OTHER): Payer: BC Managed Care – PPO | Admitting: Psychiatry

## 2022-07-25 DIAGNOSIS — F411 Generalized anxiety disorder: Secondary | ICD-10-CM | POA: Diagnosis not present

## 2022-07-25 NOTE — Progress Notes (Signed)
      [facility] Counselor/Therapist Progress Note  Patient ID: Torie Towle, MRN: [id number],    Date: 07/25/2022  Time Spent: 42 minutes start time 10:19 AM start time 11:01 AM  Treatment Type: Individual Therapy  Reported Symptoms: sleep issues, fatigue, sadness, anxiety, memory issues, focusing issues  Mental Status Exam:  Appearance:   Casual and Neat     Behavior:  Appropriate  Motor:  Normal  Speech/Language:   Normal Rate  Affect:  Appropriate  Mood:  anxious  Thought process:  normal  Thought content:    WNL  Sensory/Perceptual disturbances:    WNL  Orientation:  oriented to person, place, time/date, and situation  Attention:  Good  Concentration:  Good  Memory:  fair  Fund of knowledge:   Good  Insight:    Good  Judgment:   Good  Impulse Control:  Good   Risk Assessment: Danger to Self:  No Self-injurious Behavior: No Danger to Others: No Duty to Warn:no Physical Aggression / Violence:No  Access to Firearms a concern: No  Gang Involvement:No   Subjective: Patient was present for session. She shared she has started her new job and it is going well.  She went on to share she is realizing that she has made some mistakes like ghosting the guy she was seeing and connecting. She has  started talking to him again.  She went on to share she regrets what happened.  Patient was able to recognize that she has a pattern of staying in relationships with guys that are not good for her especially since she is continuing to have contact with her last boyfriend who cheated on her and got another girl pregnant after her miscarriage.  Patient was able to recognize that she is still healing from the miscarriage and she has contact with him more so because he is safe to them because she wants anything further future.  Patient was encouraged to be careful and to recognize she is enough and deserves to be treated with kindness and respect.  Discussed different red flags in  relationships and the importance of her setting some appropriate limits.  Patient reported buying a burn journal discussed what she could put into her journal to release it appropriately and how to continue moving forward in her life and in treatment.  Interventions: Solution-Oriented/Positive Psychology and Insight-Oriented  Diagnosis:   ICD-10-CM   1. Generalized anxiety disorder  F41.1        Plan: Patient is to use CBT and coping skills to decrease anxiety symptoms.  Patient is to continue working in her burn journal and releasing negative experiences to prepare for burning them and letting them go for a good.  Patient is to release negative emotions appropriately through exercise.  Patient is to work on talking herself through making positive choices with her self-care.  Patient is to keep working with medical providers on health issues.  Patient is to take medication as directed.  Patient is to start journaling to release negative emotions appropriately Long-term goal: Enhance ability to handle effectively the full variety of life's anxieties Short-term goal: Identify the major life complex in the past and present the form the basis for present anxiety  Lina Sayre, [facility]

## 2022-08-08 ENCOUNTER — Ambulatory Visit (INDEPENDENT_AMBULATORY_CARE_PROVIDER_SITE_OTHER): Payer: Self-pay | Admitting: Psychiatry

## 2022-08-08 DIAGNOSIS — F411 Generalized anxiety disorder: Secondary | ICD-10-CM

## 2022-08-08 NOTE — Progress Notes (Signed)
NS

## 2022-11-28 ENCOUNTER — Ambulatory Visit: Payer: BC Managed Care – PPO | Admitting: Psychiatry

## 2023-01-15 ENCOUNTER — Ambulatory Visit: Payer: BC Managed Care – PPO | Admitting: Psychiatry

## 2023-01-29 ENCOUNTER — Other Ambulatory Visit: Payer: Self-pay | Admitting: Neurology

## 2023-01-29 DIAGNOSIS — E236 Other disorders of pituitary gland: Secondary | ICD-10-CM

## 2023-01-29 DIAGNOSIS — G43719 Chronic migraine without aura, intractable, without status migrainosus: Secondary | ICD-10-CM

## 2023-02-09 ENCOUNTER — Other Ambulatory Visit: Payer: BC Managed Care – PPO

## 2024-01-31 ENCOUNTER — Emergency Department

## 2024-01-31 ENCOUNTER — Emergency Department
Admission: EM | Admit: 2024-01-31 | Discharge: 2024-01-31 | Disposition: A | Discharge status: Home / Self Care | Attending: Emergency Medicine | Admitting: Emergency Medicine

## 2024-01-31 ENCOUNTER — Other Ambulatory Visit: Payer: Self-pay

## 2024-01-31 DIAGNOSIS — R102 Pelvic and perineal pain: Secondary | ICD-10-CM | POA: Diagnosis not present

## 2024-01-31 DIAGNOSIS — N939 Abnormal uterine and vaginal bleeding, unspecified: Secondary | ICD-10-CM | POA: Diagnosis present

## 2024-01-31 DIAGNOSIS — J45909 Unspecified asthma, uncomplicated: Secondary | ICD-10-CM | POA: Diagnosis not present

## 2024-01-31 DIAGNOSIS — N938 Other specified abnormal uterine and vaginal bleeding: Secondary | ICD-10-CM

## 2024-01-31 DIAGNOSIS — N83292 Other ovarian cyst, left side: Secondary | ICD-10-CM | POA: Diagnosis not present

## 2024-01-31 DIAGNOSIS — N83209 Unspecified ovarian cyst, unspecified side: Secondary | ICD-10-CM

## 2024-01-31 LAB — CBC WITH DIFFERENTIAL/PLATELET
Abs Immature Granulocytes: 0.01 10*3/uL (ref 0.00–0.07)
Basophils Absolute: 0.1 10*3/uL (ref 0.0–0.1)
Basophils Relative: 1 %
Eosinophils Absolute: 0.1 10*3/uL (ref 0.0–0.5)
Eosinophils Relative: 2 %
HCT: 44.3 % (ref 36.0–46.0)
Hemoglobin: 14.2 g/dL (ref 12.0–15.0)
Immature Granulocytes: 0 %
Lymphocytes Relative: 35 %
Lymphs Abs: 2.3 10*3/uL (ref 0.7–4.0)
MCH: 30.1 pg (ref 26.0–34.0)
MCHC: 32.1 g/dL (ref 30.0–36.0)
MCV: 93.9 fL (ref 80.0–100.0)
Monocytes Absolute: 0.4 10*3/uL (ref 0.1–1.0)
Monocytes Relative: 7 %
Neutro Abs: 3.7 10*3/uL (ref 1.7–7.7)
Neutrophils Relative %: 55 %
Platelets: 313 10*3/uL (ref 150–400)
RBC: 4.72 MIL/uL (ref 3.87–5.11)
RDW: 13.8 % (ref 11.5–15.5)
WBC: 6.5 10*3/uL (ref 4.0–10.5)
nRBC: 0 % (ref 0.0–0.2)

## 2024-01-31 LAB — COMPREHENSIVE METABOLIC PANEL WITH GFR
ALT: 17 U/L (ref 0–44)
AST: 27 U/L (ref 15–41)
Albumin: 4.7 g/dL (ref 3.5–5.0)
Alkaline Phosphatase: 87 U/L (ref 38–126)
Anion gap: 11 (ref 5–15)
BUN: 11 mg/dL (ref 6–20)
CO2: 26 mmol/L (ref 22–32)
Calcium: 9.8 mg/dL (ref 8.9–10.3)
Chloride: 100 mmol/L (ref 98–111)
Creatinine, Ser: 0.85 mg/dL (ref 0.44–1.00)
GFR, Estimated: >60 mL/min (ref >60)
Glucose, Bld: 120 mg/dL — ABNORMAL HIGH (ref 70–99)
Potassium: 4.3 mmol/L (ref 3.5–5.1)
Sodium: 137 mmol/L (ref 135–145)
Total Bilirubin: 0.5 mg/dL (ref 0.0–1.2)
Total Protein: 8.6 g/dL — ABNORMAL HIGH (ref 6.5–8.1)

## 2024-01-31 LAB — URINALYSIS, ROUTINE W REFLEX MICROSCOPIC
Bilirubin Urine: NEGATIVE
Glucose, UA: NEGATIVE mg/dL
Ketones, ur: NEGATIVE mg/dL
Leukocytes,Ua: NEGATIVE
Nitrite: NEGATIVE
Protein, ur: NEGATIVE mg/dL
Specific Gravity, Urine: 1.001 — ABNORMAL LOW (ref 1.005–1.030)
pH: 7 (ref 5.0–8.0)

## 2024-01-31 LAB — TSH: TSH: 1.028 u[IU]/mL (ref 0.350–4.500)

## 2024-01-31 LAB — POC URINE PREG, ED: Preg Test, Ur: NEGATIVE

## 2024-01-31 MED ORDER — MEDROXYPROGESTERONE ACETATE 5 MG PO TABS: 5.0000 mg | ORAL_TABLET | Freq: Every day | ORAL | 0 refills | Status: DC

## 2024-01-31 MED ORDER — TRAMADOL HCL 50 MG PO TABS: 50.0000 mg | ORAL_TABLET | Freq: Four times a day (QID) | ORAL | 0 refills | Status: DC | PRN

## 2024-01-31 NOTE — ED Triage Notes (Signed)
 Patient states she has had her menstrual cycle for 12 days; pelvic cramping and vaginal bleeding.

## 2024-01-31 NOTE — Discharge Instructions (Signed)
 Follow-up with Dr. Lenward Railing office.  Please call for an appointment. Take your medication as prescribed Return if worsening

## 2024-01-31 NOTE — ED Provider Notes (Signed)
 [facility] Provider Note    Event Date/Time   First MD Initiated Contact with Patient 01/31/24 1433     (approximate)   History   Vaginal Bleeding   HPI  Elizabeth Bell is a 23 y.o. female history of asthma, headaches presents emergency department with heavy vaginal bleeding.  Patient's been on her menstrual cycle for 12 days.  However this past week she has been going through frequent pad changes.  States today she felt a gush of blood which soaked her pad through close.  States is continue to have this type of bleeding.  Family history of hypothyroidism in which they had menorrhagia.  Denies recent pregnancy.  No dizziness.      Physical Exam   Triage Vital Signs: ED Triage Vitals  Encounter Vitals Group     BP 01/31/24 1353 124/85     Systolic BP Percentile --      Diastolic BP Percentile --      Pulse Rate 01/31/24 1353 77     Resp 01/31/24 1353 20     Temp --      Temp src --      SpO2 01/31/24 1353 100 %     Weight --      Height --      Head Circumference --      Peak Flow --      Pain Score 01/31/24 1352 10     Pain Loc --      Pain Education --      Exclude from Growth Chart --     Most recent vital signs: Vitals:   01/31/24 1353  BP: 124/85  Pulse: 77  Resp: 20  SpO2: 100%     General: Awake, no distress.   CV:  Good peripheral perfusion.  Resp:  Normal effort.  Abd:  No distention.  Tender in both lower quadrants Other:      ED Results / Procedures / Treatments   Labs (all labs ordered are listed, but only abnormal results are displayed) Labs Reviewed  COMPREHENSIVE METABOLIC PANEL WITH GFR - Abnormal; Notable for the following components:      Result Value   Glucose, Bld 120 (*)    Total Protein 8.6 (*)    All other components within normal limits  URINALYSIS, ROUTINE W REFLEX MICROSCOPIC - Abnormal; Notable for the following components:   Color, Urine YELLOW (*)    APPearance CLEAR (*)    Specific  Gravity, Urine 1.001 (*)    Hgb urine dipstick LARGE (*)    Bacteria, UA RARE (*)    All other components within normal limits  CBC WITH DIFFERENTIAL/PLATELET  TSH  POC URINE PREG, ED     EKG     RADIOLOGY Ultrasound    PROCEDURES:   Procedures Chief Complaint  Patient presents with   Vaginal Bleeding      MEDICATIONS ORDERED IN ED: Medications - No data to display   IMPRESSION / MDM / ASSESSMENT AND PLAN / ED COURSE  I reviewed the triage vital signs and the nursing notes.                              Differential diagnosis includes, but is not limited to, menorrhagia, abnormal uterine bleeding, miscarriage, anemia, hypothyroidism  Patient's presentation is most consistent with acute illness / injury with system symptoms.   Patient's labs are reassuring  Ultrasound of the  pelvis complete shows a large hemorrhagic cyst on the left ovary, complex Erven Heater area noted.  This was independently reviewed interpreted by me.  Radiologist also comments no fibroids, endometrium 17.5 mm.  Follow-up with GYN to assure resolution after hormone therapy.  Should have a repeat ultrasound about 6 weeks  I did explain all of these findings to the patient.  She is to follow-up with [facility] GYN as she does not have a regular GYN doctor.  Did explain to her that she does not have hypothyroidism as her TSH is normal, has not had a miscarriage as she was not pregnant, is not anemic as her blood cell counts are normal.  Feel that some of the heavy bleeding could actually be due to the hemorrhagic cyst versus abnormal uterine bleeding.  Will start her on Provera  5 mg daily for 10 days.  If the symptoms resolved with the medication she can needs to still continue to follow-up with GYN due to the large hemorrhagic cyst.  She is in agreement treatment plan.  Discharged stable condition.  Did prescribe her tramadol  for pain to use when not driving or working.      FINAL CLINICAL  IMPRESSION(S) / ED DIAGNOSES   Final diagnoses:  Dysfunctional uterine bleeding  Hemorrhagic cyst of ovary     Rx / DC Orders   ED Discharge Orders          Ordered    medroxyPROGESTERone  (PROVERA ) 5 MG tablet  Daily        01/31/24 1647    traMADol  (ULTRAM ) 50 MG tablet  Every 6 hours PRN        01/31/24 1647             Note:  This document was prepared using Dragon voice recognition software and may include unintentional dictation errors.    Delsie Figures, PA-C 01/31/24 1655    Jacquie Maudlin, MD 02/05/24 1450

## 2024-03-20 ENCOUNTER — Encounter: Payer: Self-pay | Admitting: Certified Nurse Midwife

## 2024-03-20 ENCOUNTER — Ambulatory Visit: Admitting: Certified Nurse Midwife

## 2024-03-20 VITALS — BP 118/75 | HR 75 | Ht 65.0 in | Wt 111.3 lb

## 2024-03-20 DIAGNOSIS — N83202 Unspecified ovarian cyst, left side: Secondary | ICD-10-CM | POA: Diagnosis not present

## 2024-03-20 NOTE — Patient Instructions (Signed)
 Ovarian Cyst  An ovarian cyst is a fluid-filled sac that forms on an ovary. The ovaries are small organs that produce eggs in females. Many types of cysts can form on the ovaries. Most of these cysts go away on their own and are not cancer. Some cysts need treatment. What are the causes? Ovarian cysts may be caused by: Chemical or hormone changes in your body during your normal monthly period. Polycystic ovarian syndrome (PCOS). This is a common problem of the hormones. Endometriosis. The tissue that lines the inside of the uterus grows outside of the uterus. If it grows on your ovaries, it can form cysts. Pregnancy. Your body makes more hormones than normal to support the pregnancy. This can form cysts. Infection. Infection in your uterus can spread to your ovaries and can form cysts. What increases the risk? You are more likely to get this condition if: You take medicines to help you get pregnant. You have family members who have ovarian cysts. What are the signs or symptoms? Many ovarian cysts do not cause symptoms. If you have symptoms, they may include: Pain or pressure around the pelvis. The pelvis is the area between the hip bones. Pain in the lower belly. Pain during sex. Swelling in the lower belly. Periods that don't come at the same time each month. Pain during a period. How is this diagnosed? These cysts are found during a routine exam of the pelvis. You may have other tests to find out more about the cysts. Tests include ultrasound, CT scan, MRI, and blood tests. How is this treated? Many ovarian cysts go away on their own without treatment. When treatment is needed, it may include: Medicines to help treat pain. A procedure to drain the cyst. Surgery to take out the cyst. Hormones or birth control pills. Surgery to take out the ovary. Follow these instructions at home: Take all medicines only as told by your health care provider. If told, get Pap tests and regular exams  of the pelvis. Do not smoke, vape, or use nicotine or tobacco. Return to your normal activities when your provider says that it's safe. Keep all follow-up visits. Your provider may want to check your cyst for 2-3 months to see if it changes. If you're in menopause, it's important to have your cyst checked closely because females in this age group have a higher rate of cancer of the ovaries. Contact a health care provider if: You have any new symptoms. Your symptoms get worse. Get help right away if: You have really bad pain in the belly or pelvis, and the pain comes on all of a sudden. You have heavy bleeding that soaks through more than one pad every hour. This information is not intended to replace advice given to you by your health care provider. Make sure you discuss any questions you have with your health care provider. Document Revised: 06/14/2023 Document Reviewed: 01/09/2023 Elsevier Patient Education  2024 [employer].

## 2024-03-20 NOTE — Progress Notes (Signed)
 GYN ENCOUNTER NOTE  Subjective:       Elizabeth Bell is a 23 y.o. No obstetric history on file. female is here for gynecologic evaluation of the following issues:  1. Follow up . Pt seen in Ed 01/31/24  and found to have 6 cm left cyst.    Gynecologic History No LMP recorded (within weeks). Contraception: none Last Pap: 09/30/2020. Results were: normal Last mammogram: n/a .  Obstetric History OB History  No obstetric history on file.    Past Medical History:  Diagnosis Date   Asthma    Headache     No past surgical history on file.  Current Outpatient Medications on File Prior to Visit  Medication Sig Dispense Refill   ADVAIR HFA 115-21 MCG/ACT inhaler SMARTSIG:2 Puff(s) By Mouth Twice Daily     albuterol  (PROVENTIL ) (2.5 MG/3ML) 0.083% nebulizer solution Inhale 2.5 mg into the lungs.     albuterol  (VENTOLIN  HFA) 108 (90 Base) MCG/ACT inhaler SMARTSIG:2 Puff(s) By Mouth Every 4 Hours PRN     Ascorbic Acid (VITA-C PO) Take by mouth.     Biotin 1 MG CAPS Take by mouth.     diphenhydrAMINE HCl, Sleep, 50 MG/30ML LIQD Take by mouth.     fluticasone  (FLONASE ) 50 MCG/ACT nasal spray Place 1 spray into the nose.     promethazine-dextromethorphan (PROMETHAZINE-DM) 6.25-15 MG/5ML syrup Take 5 mLs by mouth.     Sodium Caprylate POWD Take by mouth.     zinc gluconate 50 MG tablet Take 50 mg by mouth.     No current facility-administered medications on file prior to visit.    Allergies  Allergen Reactions   Ivp Dye [Iodinated Contrast Media]     Social History   Socioeconomic History   Marital status: Single    Spouse name: Not on file   Number of children: Not on file   Years of education: Not on file   Highest education level: Not on file  Occupational History   Not on file  Tobacco Use   Smoking status: Never   Smokeless tobacco: Never  Vaping Use   Vaping status: Some Days  Substance and Sexual Activity   Alcohol use: Yes   Drug use: Yes    Types: Marijuana    Sexual activity: Yes    Birth control/protection: None  Other Topics Concern   Not on file  Social History Narrative   Not on file   Social Drivers of Health   Financial Resource Strain: Medium Risk (04/04/2022)   Received from [facility]   Overall Financial Resource Strain (CARDIA)    Difficulty of Paying Living Expenses: Somewhat hard  Food Insecurity: No Food Insecurity (09/06/2023)   Received from [facility]   Hunger Vital Sign    Within the past 12 months, you worried that your food would run out before you got the money to buy more.: Never true    Within the past 12 months, the food you bought just didn't last and you didn't have money to get more.: Never true  Transportation Needs: Unmet Transportation Needs (04/04/2022)   Received from [facility]   PRAPARE - Transportation    Lack of Transportation (Medical): Yes    Lack of Transportation (Non-Medical): Yes  Physical Activity: Inactive (04/04/2022)   Received from [facility]   Exercise Vital Sign    On average, how many days per week do you engage in moderate to strenuous exercise (like a brisk walk)?: 0  days    On average, how many minutes do you engage in exercise at this level?: 0 min  Stress: Stress Concern Present (04/04/2022)   Received from [facility] - Occupational Stress Questionnaire    Feeling of Stress : To some extent  Social Connections: Moderately Isolated (04/04/2022)   Received from [facility]   Social Connection and Isolation Panel    In a typical week, how many times do you talk on the phone with family, friends, or neighbors?: Twice a week    How often do you get together with friends or relatives?: Twice a week    How often do you attend church or religious services?: 1 to 4 times per year    Do you belong to any clubs or organizations such as church groups, unions, fraternal or athletic groups, or school groups?: No    How often do  you attend meetings of the clubs or organizations you belong to?: Never    Are you married, widowed, divorced, separated, never married, or living with a partner?: Never married  Intimate Partner Violence: Not At Risk (04/04/2022)   Received from [facility]   Humiliation, Afraid, Rape, and Kick questionnaire    Within the last year, have you been afraid of your partner or ex-partner?: No    Within the last year, have you been humiliated or emotionally abused in other ways by your partner or ex-partner?: No    Within the last year, have you been kicked, hit, slapped, or otherwise physically hurt by your partner or ex-partner?: No    Within the last year, have you been raped or forced to have any kind of sexual activity by your partner or ex-partner?: No    Family History  Problem Relation Age of Onset   Diabetes Mother    Bipolar disorder Sister     The following portions of the patient's history were reviewed and updated as appropriate: allergies, current medications, past family history, past medical history, past social history, past surgical history and problem list.  Review of Systems Review of Systems - Negative except as mentioned in HPI  Review of Systems - General ROS: negative for - chills, fatigue, fever, hot flashes, malaise or night sweats Hematological and Lymphatic ROS: negative for - bleeding problems or swollen lymph nodes Gastrointestinal ROS: negative for - abdominal pain, blood in stools, change in bowel habits and nausea/vomiting Musculoskeletal ROS: negative for - joint pain, muscle pain or muscular weakness Genito-Urinary ROS: negative for - change in menstrual cycle, dysmenorrhea, dyspareunia, dysuria, genital discharge, genital ulcers, hematuria, incontinence, irregular/heavy menses, nocturia. Positive for left sided pelvic pain  Objective:   BP 118/75   Pulse 75   Ht 5' 5 (1.651 m)   Wt 111 lb 4.8 oz (50.5 kg)   LMP  (Within Weeks)   BMI 18.52 kg/m   CONSTITUTIONAL: Well-developed, well-nourished female in no acute distress.  HENT:  Normocephalic, atraumatic.  NECK: Normal range of motion, supple, no masses.  Normal thyroid .  SKIN: Skin is warm and dry. No rash noted. Not diaphoretic. No erythema. No pallor. NEUROLGIC: Alert and oriented to person, place, and time. PSYCHIATRIC: Normal mood and affect. Normal behavior. Normal judgment and thought content. CARDIOVASCULAR:RRR  RESPIRATORY:Pink , unlabored breathing.  BREASTS: Not Examined ABDOMEN: Soft, non distended; Non tender.  No Organomegaly. PELVIC: not indicated for follow up.  MUSCULOSKELETAL: Normal range of motion. No tenderness.  No cyanosis, clubbing, or  edema.  CLINICAL DATA:  Vaginal bleeding for 12 days.   EXAM: TRANSABDOMINAL AND TRANSVAGINAL ULTRASOUND OF PELVIS   TECHNIQUE: Both transabdominal and transvaginal ultrasound examinations of the pelvis were performed. Transabdominal technique was performed for global imaging of the pelvis including uterus, ovaries, adnexal regions, and pelvic cul-de-sac. It was necessary to proceed with endovaginal exam following the transabdominal exam to visualize the ovaries and endometrium.   COMPARISON:  None Available.   FINDINGS: Uterus   Measurements: 6.3 x 4.6 x 3.2 cm = volume: 49 mL. No fibroids or other mass visualized.   Endometrium   Thickness: 17.5.  No focal abnormality visualized.   Right ovary   Measurements: 3.2 x 1.9 x 3.0 cm = volume: 10 mL. Normal appearance/no adnexal mass.   Left ovary   Measurements: 6.3 x 4.6 x 5.9 cm = volume: 80 mL. There is a 5.8 x 4.1 x 6.0 cm complex cystic area in the left ovary with internal lace-like pattern favored as a hemorrhagic cyst.   Other findings   There is trace free fluid in the pelvis.   IMPRESSION: 1. There is a 6 cm complex cystic area in the left ovary with internal lace-like pattern favored as a hemorrhagic cyst. Follow-up ultrasound in 6-12 weeks  is recommended to ensure resolution. 2. Trace free fluid in the pelvis. 3. Endometrial thickness is 17.5 mm. If bleeding remains unresponsive to hormonal or medical therapy, focal lesion work-up with sonohysterogram should be considered. Endometrial biopsy should also be considered in pre-menopausal patients at high risk for endometrial carcinoma. (Ref: Radiological Reasoning: Algorithmic Workup of Abnormal Vaginal Bleeding with Endovaginal Sonography and Sonohysterography. AJR 2008; 808:D31-26)     Electronically Signed   By: Greig Pique M.D.   On: 01/31/2024 16:32   Assessment:   Left Hemorrhagic cyst    Plan:   Discussed follow up u/s. Orders placed. Reviewed pathology and treatment with tylenol , Advil, and heating pad. She verbalizes understanding.   Follow up prn for annual exam/pap   Zelda Hummer, CNM

## 2024-03-25 ENCOUNTER — Ambulatory Visit

## 2024-03-25 ENCOUNTER — Ambulatory Visit (INDEPENDENT_AMBULATORY_CARE_PROVIDER_SITE_OTHER)

## 2024-03-25 ENCOUNTER — Other Ambulatory Visit (HOSPITAL_COMMUNITY)
Admission: RE | Admit: 2024-03-25 | Discharge: 2024-03-25 | Disposition: A | Discharge status: Home / Self Care | Source: Ambulatory Visit | Attending: Certified Nurse Midwife | Admitting: Certified Nurse Midwife

## 2024-03-25 VITALS — BP 118/75 | HR 75 | Ht 65.0 in | Wt 114.7 lb

## 2024-03-25 DIAGNOSIS — Z113 Encounter for screening for infections with a predominantly sexual mode of transmission: Secondary | ICD-10-CM | POA: Insufficient documentation

## 2024-03-25 DIAGNOSIS — N83202 Unspecified ovarian cyst, left side: Secondary | ICD-10-CM

## 2024-03-25 NOTE — Progress Notes (Signed)
    NURSE VISIT NOTE  Subjective:    Patient ID: Elizabeth Bell, female    DOB: [birth year], 23 y.o.   MRN: [id number]  HPI  Patient is a 23 y.o.female who presents for routine STD screen.  She is sexually active and uses condoms.  She denies any signs or symptoms of STD or known exposure.  She reports frequent yeast infections and is specifically looking to be tested for yeast.  She denies any vaginal itching or irritation.  She requests a full STD screen including blood work today.   Objective:    BP 118/75   Pulse 75   Ht 5' 5 (1.651 m)   Wt 114 lb 11.2 oz (52 kg)   LMP 02/20/2024 (Exact Date)   BMI 19.09 kg/m      Assessment:   1. Screen for STD (sexually transmitted disease)       Plan:   HIV, RPR, HepB and HepC drawn today. Aptima swab sent to lab for GC, CT, Trich, BV and yeast.  Patient declines pregnancy test today - states her period usually comes by the 7th of each month.  We will call with any abnormal results.  Return to clinic as needed.   Rollo FORBES Louder, RN

## 2024-03-26 LAB — HEPATITIS B SURFACE ANTIGEN: Hepatitis B Surface Ag: NEGATIVE

## 2024-03-26 LAB — RPR: RPR Ser Ql: NONREACTIVE

## 2024-03-26 LAB — HIV ANTIBODY (ROUTINE TESTING W REFLEX): HIV Screen 4th Generation wRfx: NONREACTIVE

## 2024-03-26 LAB — HEPATITIS C ANTIBODY: Hep C Virus Ab: NONREACTIVE

## 2024-03-27 ENCOUNTER — Other Ambulatory Visit: Payer: Self-pay

## 2024-03-27 ENCOUNTER — Ambulatory Visit: Payer: Self-pay

## 2024-03-27 DIAGNOSIS — B9689 Other specified bacterial agents as the cause of diseases classified elsewhere: Secondary | ICD-10-CM

## 2024-03-27 DIAGNOSIS — B379 Candidiasis, unspecified: Secondary | ICD-10-CM

## 2024-03-27 LAB — CERVICOVAGINAL ANCILLARY ONLY
Bacterial Vaginitis (gardnerella): POSITIVE — AB
Candida Glabrata: NEGATIVE
Candida Vaginitis: POSITIVE — AB
Chlamydia: NEGATIVE
Comment: NEGATIVE
Comment: NEGATIVE
Comment: NEGATIVE
Comment: NEGATIVE
Comment: NEGATIVE
Comment: NORMAL
Neisseria Gonorrhea: NEGATIVE
Trichomonas: NEGATIVE

## 2024-03-27 MED ORDER — FLUCONAZOLE 150 MG PO TABS: 150.0000 mg | ORAL_TABLET | Freq: Once | ORAL | 0 refills | Status: AC

## 2024-03-27 MED ORDER — METRONIDAZOLE 500 MG PO TABS: 500.0000 mg | ORAL_TABLET | Freq: Two times a day (BID) | ORAL | 0 refills | Status: AC

## 2024-04-01 ENCOUNTER — Encounter: Payer: Self-pay | Admitting: Certified Nurse Midwife
